# Patient Record
Sex: Female | Born: 1945 | Hispanic: Yes | Marital: Married | Smoking: Never smoker
Health system: Southern US, Community
[De-identification: ages and names within clinical notes are randomized; demographics above are authoritative.]

## PROBLEM LIST (undated history)

## (undated) DIAGNOSIS — K5790 Diverticulosis of intestine, part unspecified, without perforation or abscess without bleeding: Secondary | ICD-10-CM

## (undated) DIAGNOSIS — H269 Unspecified cataract: Secondary | ICD-10-CM

## (undated) DIAGNOSIS — I10 Essential (primary) hypertension: Secondary | ICD-10-CM

## (undated) DIAGNOSIS — D0510 Intraductal carcinoma in situ of unspecified breast: Secondary | ICD-10-CM

## (undated) DIAGNOSIS — I341 Nonrheumatic mitral (valve) prolapse: Secondary | ICD-10-CM

## (undated) DIAGNOSIS — E785 Hyperlipidemia, unspecified: Secondary | ICD-10-CM

## (undated) HISTORY — PX: CATARACT EXTRACTION, BILATERAL: SHX1313

## (undated) HISTORY — DX: Unspecified cataract: H26.9

## (undated) HISTORY — DX: Hyperlipidemia, unspecified: E78.5

## (undated) HISTORY — DX: Intraductal carcinoma in situ of unspecified breast: D05.10

## (undated) HISTORY — DX: Essential (primary) hypertension: I10

## (undated) HISTORY — DX: Diverticulosis of intestine, part unspecified, without perforation or abscess without bleeding: K57.90

## (undated) HISTORY — PX: BREAST SURGERY: SHX581

## (undated) HISTORY — PX: EYE SURGERY: SHX253

## (undated) HISTORY — DX: Nonrheumatic mitral (valve) prolapse: I34.1

## (undated) HISTORY — PX: TOTAL ABDOMINAL HYSTERECTOMY W/ BILATERAL SALPINGOOPHORECTOMY: SHX83

---

## 2006-01-26 HISTORY — PX: ABDOMINAL HYSTERECTOMY: SHX81

## 2010-05-27 DEATH — deceased

## 2011-03-20 ENCOUNTER — Other Ambulatory Visit: Payer: Self-pay | Admitting: Radiology

## 2011-03-20 DIAGNOSIS — C50912 Malignant neoplasm of unspecified site of left female breast: Secondary | ICD-10-CM

## 2011-03-23 ENCOUNTER — Inpatient Hospital Stay: Admission: RE | Admit: 2011-03-23 | Payer: Self-pay | Source: Ambulatory Visit

## 2011-07-06 ENCOUNTER — Telehealth: Payer: Self-pay | Admitting: *Deleted

## 2011-07-06 ENCOUNTER — Encounter: Payer: Self-pay | Admitting: *Deleted

## 2011-07-06 NOTE — Progress Notes (Signed)
Patient and her daughter came in the office w/ paperwork since mother has moved from another state and needs to obtain care here in [place] now.  Tami recommended them to see Dr. Darnelle Catalan since he speaks spanish.  I confirmed 07/17/11 appt w/ them.  Gave them before appt letter & appointment card and [facility] gave them Alight bag and calendar, etc.  Took paperwork to Med Rec to make chart.

## 2011-07-06 NOTE — Telephone Encounter (Signed)
Spoke with Tonia Ghent and requested for Raynelle Fanning to come meet patient on 07/17/11.

## 2011-07-16 ENCOUNTER — Other Ambulatory Visit: Payer: Self-pay | Admitting: Oncology

## 2011-07-16 DIAGNOSIS — Z853 Personal history of malignant neoplasm of breast: Secondary | ICD-10-CM

## 2011-07-17 ENCOUNTER — Ambulatory Visit (HOSPITAL_BASED_OUTPATIENT_CLINIC_OR_DEPARTMENT_OTHER): Payer: Medicare Other | Admitting: Oncology

## 2011-07-17 ENCOUNTER — Other Ambulatory Visit (HOSPITAL_BASED_OUTPATIENT_CLINIC_OR_DEPARTMENT_OTHER): Payer: Medicare Other | Admitting: Lab

## 2011-07-17 ENCOUNTER — Other Ambulatory Visit: Payer: Self-pay | Admitting: Oncology

## 2011-07-17 ENCOUNTER — Ambulatory Visit: Payer: Self-pay

## 2011-07-17 VITALS — BP 127/65 | HR 65 | Temp 97.8°F | Ht 58.2 in | Wt 143.7 lb

## 2011-07-17 DIAGNOSIS — Z170 Estrogen receptor positive status [ER+]: Secondary | ICD-10-CM

## 2011-07-17 DIAGNOSIS — Z853 Personal history of malignant neoplasm of breast: Secondary | ICD-10-CM

## 2011-07-17 DIAGNOSIS — C50919 Malignant neoplasm of unspecified site of unspecified female breast: Secondary | ICD-10-CM

## 2011-07-17 LAB — COMPREHENSIVE METABOLIC PANEL
Albumin: 4.1 g/dL (ref 3.5–5.2)
BUN: 18 mg/dL (ref 6–23)
CO2: 23 mEq/L (ref 19–32)
Calcium: 9.4 mg/dL (ref 8.4–10.5)
Chloride: 106 mEq/L (ref 96–112)
Glucose, Bld: 86 mg/dL (ref 70–99)
Potassium: 4.2 mEq/L (ref 3.5–5.3)
Sodium: 140 mEq/L (ref 135–145)
Total Protein: 7 g/dL (ref 6.0–8.3)

## 2011-07-17 LAB — CBC WITH DIFFERENTIAL/PLATELET
Basophils Absolute: 0 10*3/uL (ref 0.0–0.1)
Eosinophils Absolute: 0.8 10*3/uL — ABNORMAL HIGH (ref 0.0–0.5)
HGB: 13.6 g/dL (ref 11.6–15.9)
MCV: 84.3 fL (ref 79.5–101.0)
MONO#: 0.2 10*3/uL (ref 0.1–0.9)
MONO%: 5.1 % (ref 0.0–14.0)
NEUT#: 2 10*3/uL (ref 1.5–6.5)
RDW: 13.1 % (ref 11.2–14.5)
WBC: 4.1 10*3/uL (ref 3.9–10.3)
lymph#: 1.1 10*3/uL (ref 0.9–3.3)

## 2011-07-17 MED ORDER — TAMOXIFEN CITRATE 20 MG PO TABS: 20.0000 mg | ORAL_TABLET | Freq: Every day | ORAL | Status: AC

## 2011-07-17 NOTE — Telephone Encounter (Signed)
gve the pt her oct 2013 appt calendar. S/w luwana from clinical social work to set up an interpreter for the pt's

## 2011-07-19 ENCOUNTER — Encounter: Payer: Self-pay | Admitting: Oncology

## 2011-07-19 NOTE — Progress Notes (Signed)
ID: Barbara Washington   DOB: [birth year]  MR#: [id number]  WUJ#:[id number]  HISTORY OF PRESENT ILLNESS: The patient had routine screening mammography at [facility] 03/16/2011, suggesting new microcalcifications in the left breast. She was recalled for additional views 03/18/2011 and this confirmed a cluster of pleomorphic microcalcifications in the upper outer quadrant of the left breast, measuring approximately 1.2 cm. 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  Because the patient's daughter, who is a physician, lives in [place], the patient went there for definitive treatment, and after appropriate discussion underwent a left lumpectomy 03/31/2011 under Dr.Gwenn Pavlovitz. The pathology from this procedure [facility] [id number]) showed an intermediate grade ductal carcinoma in situ measuring 9 mm, with negative margins (closest 4 mm), with Allred scores of 8 for both estrogen and progesterone receptor positivity. Postoperatively the patient received radiation to the left breast under Dr. Chrissie Noa Pao (50 gray to the breast and 10 gray he a boost to the scar) completed 06/19/2011.  The patient met with Dr.Corey Shamah to discuss systemic adjuvant therapy, and Dr. Modena Jansky recommended tamoxifen. The patient however has not get started that medication. She has returned to the [place] area and is establishing herself in our practice 07/17/2011.   REVIEW OF SYSTEMS: She did well with her left lumpectomy, although she still has some pain in the surgical area. She describes this as brief stabbing pains, which of course are common in this situation. She describes herself is moderately fatigued. She has occasional palpitations, and admits to significant caffeine ingestion daily. She can be short of breath when walking up stairs. She had some desquamation due to her radiation. The skin changes are  resolving. Otherwise a detailed review of systems today was noncontributory  PAST MEDICAL HISTORY: Past Medical History  Diagnosis Date  . DCIS (ductal carcinoma in situ) of breast   . Diverticular disease   . Hypertension   . Mitral valve prolapse   . Hyperlipidemia     PAST SURGICAL HISTORY: Past Surgical History  Procedure Date  . Total abdominal hysterectomy w/ bilateral salpingoophorectomy   . Cataract extraction, bilateral     FAMILY HISTORY The patient's father died at the age of 56. He had high blood pressure. The patient's mother died at the age of 35, from causes unknown to the patient. The patient has one brother and 4 sisters. There is no breast or ovarian cancer or indeed any other cancer in the family to her knowledge.  GYNECOLOGIC HISTORY: She had menarche age 87. She underwent hysterectomy January of 1998. She took hormone placement for 1 year. 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 Remotely she did some sewing work. Her husband Nadara Eaton is a Estate manager/land agent. They are originally from [place], and have lived in [place] since 2007. Their daughter in [place], Myrlene Broker [id number], is a physician, although currently not practicing. [Phone/fax = [phone]] She has 2 children. Their son Onalee Hua in [place] works in Consulting civil engineer. He has one child. The patient is a Air traffic controller   ADVANCED DIRECTIVES:  HEALTH MAINTENANCE: History  Substance Use Topics  . Smoking status: Never Smoker   . Smokeless tobacco: Never Used  . Alcohol Use: 0.0 - 0.5 oz/week    0-1 drink(s) per week     Colonoscopy: 2009  PAP: 2013  Bone density: 2013  Lipid panel: per Dr. Luiz Iron  Allergies  Allergen Reactions  . Tape     Current Outpatient Prescriptions  Medication Sig Dispense Refill  . losartan (COZAAR) 25 MG tablet Take 25 mg by mouth daily.      . naproxen sodium (ANAPROX) 220 MG tablet Take 220 mg by  mouth 2 (two) times daily with a meal.      . tamoxifen (NOLVADEX) 20 MG tablet Take 1 tablet (20 mg total) by mouth daily.  90 tablet  3    OBJECTIVE: Middle-aged Latin woman in no acute distress Filed Vitals:   07/17/11 1108  BP: 127/65  Pulse: 65  Temp: 97.8 F (36.6 C)     Body mass index is 29.83 kg/(m^2).    ECOG FS: 1  Sclerae unicteric Oropharynx clear No cervical or supraclavicular adenopathy Lungs no rales or rhonchi Heart regular rate and rhythm, no murmur or click appreciated Abd benign MSK no focal spinal tenderness, no peripheral edema Neuro: nonfocal Breasts: Right breast is unremarkable. The left breast is status post lumpectomy and radiation. There is still some erythema over the port site, but no desquamation. There are no palpable masses. There are no suspicious skin or nipple findings. The left axilla is clear  LAB RESULTS: Lab Results  Component Value Date   WBC 4.1 07/17/2011   NEUTROABS 2.0 07/17/2011   HGB 13.6 07/17/2011   HCT 39.7 07/17/2011   MCV 84.3 07/17/2011   PLT 219 07/17/2011      Chemistry      Component Value Date/Time   NA 140 07/17/2011 1056   K 4.2 07/17/2011 1056   CL 106 07/17/2011 1056   CO2 23 07/17/2011 1056   BUN 18 07/17/2011 1056   CREATININE 0.69 07/17/2011 1056      Component Value Date/Time   CALCIUM 9.4 07/17/2011 1056   ALKPHOS 75 07/17/2011 1056   AST 23 07/17/2011 1056   ALT 23 07/17/2011 1056   BILITOT 0.3 07/17/2011 1056       Lab Results  Component Value Date   LABCA2 23 07/17/2011    No components found with this basename: [id number]    No results found for this basename: INR:1;PROTIME:1 in the last 168 hours  Urinalysis No results found for this basename: colorurine,  appearanceur,  labspec,  phurine,  glucoseu,  hgbur,  bilirubinur,  ketonesur,  proteinur,  urobilinogen,  nitrite,  leukocytesur    STUDIES: CT scans of the chest abdomen and pelvis were obtained 04/28/2011 for the evaluation of possible hilar  fullness and microscopic hematuria. The only finding of significance were scattered diverticuli  ASSESSMENT: 66 y.o. Spanish speaker originally from [place], status post left lumpectomy 03/31/2011 for a grade 2 ductal carcinoma in situ, strongly estrogen and progesterone receptor positive, with negative margins; status post radiation completed May 2013.  PLAN: We spent the better part of her hour-long visit today discussing the biology of her tumor and she understands noninvasive breast cancer is in itself not life-threatening. There is a small risk of local recurrence, and she has significantly decreased that risk already by taking radiation. She can decrease that risk further by taking antiestrogens for 5 years. In addition, tamoxifen has been shown to reduce the risk of a new breast cancer developing in either breast. That risk may be as high as 1% per year in her situation, and tamoxifen would cut that to 1/2% per year or less.  We then discussed the possible toxicities, side effects and complications of this medication, in particular  the risk of a blood clots. That risk is identical to the risk of clotting from hormone replacement, and since she took hormone replacement for 1 year without any complications her risk of developing a blood clot from tamoxifen would be remote.  Accordingly Ms. Rosano will start tamoxifen and I went ahead and rewrote that prescription for her. She will see [place] again in about 3 months. If she is tolerating tamoxifen well, we will start seeing her on a once a year basis, preferably a month or so after her yearly mammogram.   Tiquan Bouch C    07/19/2011

## 2011-07-22 ENCOUNTER — Encounter: Payer: Self-pay | Admitting: *Deleted

## 2011-07-22 NOTE — Progress Notes (Signed)
Mailed after appt letter to pt. 

## 2011-11-16 ENCOUNTER — Ambulatory Visit (HOSPITAL_BASED_OUTPATIENT_CLINIC_OR_DEPARTMENT_OTHER): Payer: BC Managed Care – PPO | Admitting: Physician Assistant

## 2011-11-16 ENCOUNTER — Telehealth: Payer: Self-pay | Admitting: *Deleted

## 2011-11-16 ENCOUNTER — Encounter: Payer: Self-pay | Admitting: Physician Assistant

## 2011-11-16 VITALS — BP 127/76 | HR 59 | Temp 97.7°F | Resp 20 | Ht <= 58 in | Wt 147.7 lb

## 2011-11-16 DIAGNOSIS — Z170 Estrogen receptor positive status [ER+]: Secondary | ICD-10-CM

## 2011-11-16 DIAGNOSIS — Z853 Personal history of malignant neoplasm of breast: Secondary | ICD-10-CM

## 2011-11-16 DIAGNOSIS — D0590 Unspecified type of carcinoma in situ of unspecified breast: Secondary | ICD-10-CM

## 2011-11-16 MED ORDER — TAMOXIFEN CITRATE 20 MG PO TABS: 20.0000 mg | ORAL_TABLET | Freq: Every day | ORAL | Status: DC

## 2011-11-16 NOTE — Progress Notes (Signed)
ID: Barbara Washington   DOB: [birth year]  MR#: [id number]  CSN#:[id number]  HISTORY OF PRESENT ILLNESS: The patient had routine screening mammography at [facility] 03/16/2011, suggesting new microcalcifications in the left breast. She was recalled for additional views 03/18/2011 and this confirmed a cluster of pleomorphic microcalcifications in the upper outer quadrant of the left breast, measuring approximately 1.2 cm. 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  Because the patient's daughter, who is a physician, lives in [place], the patient went there for definitive treatment, and after appropriate discussion underwent a left lumpectomy 03/31/2011 under Dr.Gwenn Pavlovitz. 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 Postoperatively the patient received radiation to the left breast under Dr. Chrissie Noa Pao (50 gray to the breast and 10 gray he a boost to the scar) completed 06/19/2011.  The patient met with Dr.Corey Shamah to discuss systemic adjuvant therapy, and Dr. Modena Jansky recommended tamoxifen. The patient however has not get started that medication. She has returned to the [place] area and is establishing herself in our practice 07/17/2011.    INTERVAL HISTORY: Barbara Washington returns today accompanied by her [place] interpreter, Lynden Ang, for followup of Barbara Washington's left breast carcinoma. She has been on tamoxifen since June, and is tolerating the drug very well with few side effects. She has only occasional hot flashes, nothing problematic. She has occasional pain in the left leg, but no swelling or evidence of abnormal clotting. She's had no vaginal bleeding, discharge, or dryness. No change in  vision.   REVIEW OF SYSTEMS: Overall, Jaquila is feeling extremely well. She has some mild fatigue, and occasional shortness of breath with exertion alone. This has not worsened or changed since her previous appointment here. She has had no recent fevers or chills. She's eating and drinking well with no nausea or change in bowel habits. No cough, phlegm production, or chest pain. The left breast itself is very tender to palpation. Modena denies any abnormal headaches or dizziness, and has had no additional pain, no unusual myalgias, arthralgias, or bony pain.  A detailed review of systems is otherwise stable and noncontributory.  PAST MEDICAL HISTORY: Past Medical History  Diagnosis Date  . DCIS (ductal carcinoma in situ) of breast   . Diverticular disease   . Hypertension   . Mitral valve prolapse   . Hyperlipidemia     PAST SURGICAL HISTORY: Past Surgical History  Procedure Date  . Total abdominal hysterectomy w/ bilateral salpingoophorectomy   . Cataract extraction, bilateral     FAMILY HISTORY The patient's father died at the age of 32. He had high blood pressure. The patient's mother died at the age of 33, from causes unknown to the patient. The patient has one brother and 4 sisters. There is no breast or ovarian cancer or indeed any other cancer in the family to her knowledge.  GYNECOLOGIC HISTORY: She had menarche age 66. She underwent hysterectomy January of 1998. She took hormone placement for 1 year. The patient is GX P2, first pregnancy to term age 66  SOCIAL HISTORY: [name] is chiefly a homemaker. Remotely she did some sewing work. Her husband Barbara Washington is a Estate manager/land agent. They are originally from [place], and have lived in [place] since 2007. Their daughter in [place],  Barbara Washington, is a physician, although currently not practicing. [Phone/fax = [phone]] She has 2 children. Their son Barbara Washington in [place] works in Consulting civil engineer. He has one child.  The patient is a Air traffic controller   ADVANCED DIRECTIVES:  HEALTH MAINTENANCE: History  Substance Use Topics  . Smoking status: Never Smoker   . Smokeless tobacco: Never Used  . Alcohol Use: 0.0 - 0.5 oz/week    0-1 drink(s) per week     Colonoscopy: 2009  [place]: 2013  Bone density: 2013  Lipid panel: per Dr. Luiz Iron  Allergies  Allergen Reactions  . Tape     Current Outpatient Prescriptions  Medication Sig Dispense Refill  . losartan (COZAAR) 25 MG tablet Take 25 mg by mouth daily.      . naproxen sodium (ANAPROX) 220 MG tablet Take 220 mg by mouth 2 (two) times daily with a meal.      . tamoxifen (NOLVADEX) 20 MG tablet Take 1 tablet (20 mg total) by mouth daily.  90 tablet  3    OBJECTIVE: Middle-aged Latin woman in no acute distress Filed Vitals:   11/16/11 1004  BP: 127/76  Pulse: 59  Temp: 97.7 F (36.5 C)  Resp: 20     Body mass index is 30.87 kg/(m^2).    ECOG FS: 0 Filed Weights   11/16/11 1004  Weight: 147 lb 11.2 oz (66.996 kg)   Sclerae unicteric Oropharynx clear No cervical or supraclavicular adenopathy Lungs no rales or rhonchi Heart regular rate and rhythm, no murmur or click appreciated Abd soft, nontender, with positive bowel sounds MSK no focal spinal tenderness, no peripheral edema Neuro: nonfocal, alert and oriented x3. Breasts: Right breast is unremarkable. The left breast is status post lumpectomy and radiation. There no suspicious nodularities or evidence of local recurrence The left axilla is clear  LAB RESULTS: Lab Results  Component Value Date   WBC 4.1 07/17/2011   NEUTROABS 2.0 07/17/2011   HGB 13.6 07/17/2011   HCT 39.7 07/17/2011   MCV 84.3 07/17/2011   PLT 219 07/17/2011      Chemistry      Component Value Date/Time   NA 140 07/17/2011 1056   K 4.2 07/17/2011 1056   CL 106 07/17/2011 1056   CO2 23 07/17/2011 1056   BUN 18 07/17/2011 1056   CREATININE 0.69 07/17/2011 1056      Component Value Date/Time   CALCIUM 9.4 07/17/2011 1056    ALKPHOS 75 07/17/2011 1056   AST 23 07/17/2011 1056   ALT 23 07/17/2011 1056   BILITOT 0.3 07/17/2011 1056       Lab Results  Component Value Date   LABCA2 23 07/17/2011    STUDIES: CT scans of the chest abdomen and pelvis were obtained 04/28/2011 for the evaluation of possible hilar fullness and microscopic hematuria. The only finding of significance were scattered diverticuli  ASSESSMENT: 66 y.o. Spanish speaker originally from [place],   (1)  status post left lumpectomy 03/31/2011 for a grade 2 ductal carcinoma in situ, strongly estrogen and progesterone receptor positive, with negative margins;   (2)  status post radiation completed May 2013.  (3)  On tamoxifen since 07/17/2011  PLAN:  Shelma is doing quite well with regards to her breast cancer. She will continue on tamoxifen which I have refilled for another year. She is due for her next bilateral mammogram at [facility] in February 2014. She will see Dr. Darnelle Catalan soon thereafter for repeat labs and physical exam, and at that point  we will likely begin seeing her on an annual basis.  Olimpia voices understanding and agreement with this plan, and will call with any changes or problems prior to her next scheduled appointment.   Yanely Mast    11/16/2011

## 2011-11-16 NOTE — Patient Instructions (Signed)
No changes.  Return in 1 year for lab and follow up visit

## 2011-11-16 NOTE — Telephone Encounter (Signed)
Made patient appointment for mammogram on 10-26-2012 at 10:30am spanish interpreter has been notified

## 2012-04-04 ENCOUNTER — Other Ambulatory Visit (HOSPITAL_BASED_OUTPATIENT_CLINIC_OR_DEPARTMENT_OTHER): Payer: BC Managed Care – PPO | Admitting: Lab

## 2012-04-04 DIAGNOSIS — Z853 Personal history of malignant neoplasm of breast: Secondary | ICD-10-CM

## 2012-04-04 LAB — CBC WITH DIFFERENTIAL/PLATELET
BASO%: 0.5 % (ref 0.0–2.0)
Basophils Absolute: 0 10*3/uL (ref 0.0–0.1)
HCT: 38.4 % (ref 34.8–46.6)
HGB: 12.9 g/dL (ref 11.6–15.9)
LYMPH%: 24.5 % (ref 14.0–49.7)
MCH: 29.2 pg (ref 25.1–34.0)
MCHC: 33.6 g/dL (ref 31.5–36.0)
MONO#: 0.2 10*3/uL (ref 0.1–0.9)
NEUT%: 57.7 % (ref 38.4–76.8)
Platelets: 176 10*3/uL (ref 145–400)
WBC: 4.4 10*3/uL (ref 3.9–10.3)

## 2012-04-04 LAB — COMPREHENSIVE METABOLIC PANEL (CC13)
ALT: 22 U/L (ref 0–55)
BUN: 14.7 mg/dL (ref 7.0–26.0)
CO2: 23 mEq/L (ref 22–29)
Calcium: 8.6 mg/dL (ref 8.4–10.4)
Chloride: 109 mEq/L — ABNORMAL HIGH (ref 98–107)
Creatinine: 0.7 mg/dL (ref 0.6–1.1)
Total Bilirubin: 0.34 mg/dL (ref 0.20–1.20)

## 2012-04-11 ENCOUNTER — Telehealth: Payer: Self-pay | Admitting: Oncology

## 2012-04-11 ENCOUNTER — Ambulatory Visit (HOSPITAL_BASED_OUTPATIENT_CLINIC_OR_DEPARTMENT_OTHER): Payer: BC Managed Care – PPO | Admitting: Oncology

## 2012-04-11 VITALS — BP 146/84 | HR 88 | Temp 98.2°F | Resp 20 | Ht <= 58 in | Wt 145.8 lb

## 2012-04-11 DIAGNOSIS — Z853 Personal history of malignant neoplasm of breast: Secondary | ICD-10-CM

## 2012-04-11 DIAGNOSIS — L293 Anogenital pruritus, unspecified: Secondary | ICD-10-CM

## 2012-04-11 DIAGNOSIS — Z170 Estrogen receptor positive status [ER+]: Secondary | ICD-10-CM

## 2012-04-11 MED ORDER — MICONAZOLE NITRATE 200 & 2 MG-% (9GM) VA KIT: PACK | VAGINAL | Status: DC

## 2012-04-11 NOTE — Progress Notes (Signed)
ID: Memory Dance   DOB: 10/13/1945  MR#: [id number]  WUX#:[id number]  PCP: Dennis Bast, MD   HISTORY OF PRESENT ILLNESS: The patient had routine screening mammography at [facility] 03/16/2011, suggesting new microcalcifications in the left breast. She was recalled for additional views 03/18/2011 and this confirmed a cluster of pleomorphic microcalcifications in the upper outer quadrant of the left breast, measuring approximately 1.2 cm. 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  Because the patient's daughter, who is a physician, lives in [place], the patient went there for definitive treatment, and after appropriate discussion underwent a left lumpectomy 03/31/2011 under Dr.Gwenn Pavlovitz. 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 Postoperatively the patient received radiation to the left breast under Dr. Chrissie Noa Pao (50 gray to the breast and 10 gray he a boost to the scar) completed 06/19/2011.  The patient met with Dr.Corey Shamah to discuss systemic adjuvant therapy, and Dr. Modena Jansky recommended tamoxifen. The patient however has not get started that medication. She has returned to the [place] area and is establishing herself in our practice 07/17/2011.    INTERVAL HISTORY: Jalin returns today for followup of her breast cancer. The interval history is generally unremarkable. She is tolerating tamoxifen well. Family is doing fine.  REVIEW OF SYSTEMS: She does have some hot flashes likely due to the tamoxifen but these are not bothersome enough for her to want to do anything about it. She has left shoulder and neck discomfort, associated with activity. She is very active at the gym  and of course she is left-handed and also that is the side where she had the tumor. She rubs the area and it feels better. There has been no left upper extremity lymphedema, no significant limitation in range of motion, and the discomfort there is not progressive. She has some vaginal itching, without significant drainage. She has tried some over-the-counter creams without success. Otherwise a detailed review of systems today was noncontributory.  PAST MEDICAL HISTORY: Past Medical History  Diagnosis Date  . DCIS (ductal carcinoma in situ) of breast   . Diverticular disease   . Hypertension   . Mitral valve prolapse   . Hyperlipidemia     PAST SURGICAL HISTORY: Past Surgical History  Procedure Laterality Date  . Total abdominal hysterectomy w/ bilateral salpingoophorectomy    . Cataract extraction, bilateral      FAMILY HISTORY The patient's father died at the age of 36. He had high blood pressure. The patient's mother died at the age of 31, from causes unknown to the patient. The patient has one brother and 4 sisters. There is no breast or ovarian cancer or indeed any other cancer in the family to her knowledge.  GYNECOLOGIC HISTORY: She had menarche age 68. She underwent hysterectomy January of 1998. She took hormone placement for 1 year. The patient is GX P2, first pregnancy to term age 38  SOCIAL HISTORY: Ms. Netzer is chiefly a homemaker. Remotely she did some sewing work. Her husband Nadara Eaton is a Estate manager/land agent. They are originally from [place], and have lived in [place] since 2007. Their daughter in [place], Myrlene Broker [id number], is a physician, although currently not practicing. [Phone/fax = [phone]] She has 2 children. Their son Onalee Hua in [place] works  in IT. He has one child. The patient is a Air traffic controller   ADVANCED DIRECTIVES:  HEALTH MAINTENANCE: History  Substance Use Topics  . Smoking status: Never Smoker   . Smokeless tobacco: Never Used  .  Alcohol Use: 0 - .5 oz/week    0-1 drink(s) per week     Colonoscopy: 2009  PAP: 2013  Bone density: 2013  Lipid panel: per Dr. Luiz Iron  Allergies  Allergen Reactions  . Tape     Current Outpatient Prescriptions  Medication Sig Dispense Refill  . losartan (COZAAR) 25 MG tablet Take 25 mg by mouth daily.      . naproxen sodium (ANAPROX) 220 MG tablet Take 220 mg by mouth 2 (two) times daily with a meal.      . tamoxifen (NOLVADEX) 20 MG tablet Take 1 tablet (20 mg total) by mouth daily.  90 tablet  3   No current facility-administered medications for this visit.    OBJECTIVE: Middle-aged Latin woman in no acute distress Filed Vitals:   04/11/12 1041  BP: 146/84  Pulse: 88  Temp: 98.2 F (36.8 C)  Resp: 20     Body mass index is 30.48 kg/(m^2).    ECOG FS: 0 Filed Weights   04/11/12 1041  Weight: 145 lb 12.8 oz (66.134 kg)   Sclerae unicteric Oropharynx clear No cervical or supraclavicular adenopathy Lungs no rales or rhonchi Heart regular rate and rhythm, no murmur appreciated Abd soft, nontender, with positive bowel sounds MSK no focal spinal tenderness, no peripheral edema specifically in the left upper extremity, and good range of motion both upper extremities Neuro: nonfocal, well oriented, pleasant affect Breasts: Right breast is unremarkable. The left breast is status post lumpectomy and radiation. There is an evidence of local recurrence The left axilla is benign  LAB RESULTS: Lab Results  Component Value Date   WBC 4.4 04/04/2012   NEUTROABS 2.6 04/04/2012   HGB 12.9 04/04/2012   HCT 38.4 04/04/2012   MCV 86.8 04/04/2012   PLT 176 04/04/2012      Chemistry      Component Value Date/Time   NA 139 04/04/2012 1003   NA 140 07/17/2011 1056   K 3.9 04/04/2012 1003   K 4.2 07/17/2011 1056   CL 109* 04/04/2012 1003   CL 106 07/17/2011 1056   CO2 23 04/04/2012 1003   CO2 23 07/17/2011 1056   BUN 14.7 04/04/2012 1003   BUN 18 07/17/2011 1056   CREATININE 0.7  04/04/2012 1003   CREATININE 0.69 07/17/2011 1056      Component Value Date/Time   CALCIUM 8.6 04/04/2012 1003   CALCIUM 9.4 07/17/2011 1056   ALKPHOS 52 04/04/2012 1003   ALKPHOS 75 07/17/2011 1056   AST 20 04/04/2012 1003   AST 23 07/17/2011 1056   ALT 22 04/04/2012 1003   ALT 23 07/17/2011 1056   BILITOT 0.34 04/04/2012 1003   BILITOT 0.3 07/17/2011 1056       Lab Results  Component Value Date   LABCA2 22 04/04/2012    STUDIES: No results found.   ASSESSMENT: 67 y.o. Spanish speaker originally from [place],   (1)  status post left lumpectomy 03/31/2011 for a grade 2 ductal carcinoma in situ, strongly estrogen and progesterone receptor positive, with negative margins;   (2)  status post radiation completed May 2013.  (3)  on tamoxifen since 07/17/2011  PLAN:  Chyan is doing well as far as breast cancer is concerned, and tolerating the tamoxifen  with no significant side effects. She does have some vaginal itching, which may be due simply to atrophy, but which from her description is more suggestive of a yeast infection. I went ahead and wrote her for miconazole cream and explained how to use it. If that does not clear the problem she will call us back.  Otherwise she will see [place] again in 6 months from now. Likely we will start yearly followup at that time. The plan is to continue tamoxifen for total of 5 years. She knows to call for any problems that may develop before the next visit.   MAGRINAT,GUSTAV C    04/11/2012

## 2012-04-11 NOTE — Telephone Encounter (Signed)
gv pt appt schedule for September.  °

## 2012-10-14 ENCOUNTER — Other Ambulatory Visit: Payer: Self-pay | Admitting: Physician Assistant

## 2012-10-14 DIAGNOSIS — Z853 Personal history of malignant neoplasm of breast: Secondary | ICD-10-CM

## 2012-10-17 ENCOUNTER — Other Ambulatory Visit (HOSPITAL_BASED_OUTPATIENT_CLINIC_OR_DEPARTMENT_OTHER): Payer: BC Managed Care – PPO | Admitting: Lab

## 2012-10-17 ENCOUNTER — Ambulatory Visit (HOSPITAL_BASED_OUTPATIENT_CLINIC_OR_DEPARTMENT_OTHER): Payer: BC Managed Care – PPO | Admitting: Oncology

## 2012-10-17 VITALS — BP 145/76 | HR 67 | Temp 97.8°F | Resp 18 | Ht <= 58 in | Wt 145.9 lb

## 2012-10-17 DIAGNOSIS — C50412 Malignant neoplasm of upper-outer quadrant of left female breast: Secondary | ICD-10-CM

## 2012-10-17 DIAGNOSIS — Z853 Personal history of malignant neoplasm of breast: Secondary | ICD-10-CM

## 2012-10-17 DIAGNOSIS — D0590 Unspecified type of carcinoma in situ of unspecified breast: Secondary | ICD-10-CM

## 2012-10-17 LAB — COMPREHENSIVE METABOLIC PANEL (CC13)
ALT: 13 U/L (ref 0–55)
BUN: 13.5 mg/dL (ref 7.0–26.0)
CO2: 24 mEq/L (ref 22–29)
Calcium: 8.9 mg/dL (ref 8.4–10.4)
Chloride: 108 mEq/L (ref 98–109)
Creatinine: 0.7 mg/dL (ref 0.6–1.1)
Glucose: 103 mg/dl (ref 70–140)
Total Bilirubin: 0.28 mg/dL (ref 0.20–1.20)

## 2012-10-17 LAB — CBC WITH DIFFERENTIAL/PLATELET
BASO%: 0.5 % (ref 0.0–2.0)
Basophils Absolute: 0 10*3/uL (ref 0.0–0.1)
Eosinophils Absolute: 0.5 10*3/uL (ref 0.0–0.5)
HCT: 38.9 % (ref 34.8–46.6)
HGB: 12.9 g/dL (ref 11.6–15.9)
LYMPH%: 23.6 % (ref 14.0–49.7)
MCHC: 33.2 g/dL (ref 31.5–36.0)
MONO#: 0.3 10*3/uL (ref 0.1–0.9)
NEUT#: 3.2 10*3/uL (ref 1.5–6.5)
NEUT%: 61.3 % (ref 38.4–76.8)
Platelets: 189 10*3/uL (ref 145–400)
WBC: 5.3 10*3/uL (ref 3.9–10.3)
lymph#: 1.2 10*3/uL (ref 0.9–3.3)

## 2012-10-17 NOTE — Progress Notes (Signed)
ID: Memory Dance   DOB: [birth year]  MR#: [id number]  BJY#:[id number]  PCP: Dennis Bast, MD   HISTORY OF PRESENT ILLNESS: The patient had routine screening mammography at [facility] 03/16/2011, suggesting new microcalcifications in the left breast. She was recalled for additional views 03/18/2011 and this confirmed a cluster of pleomorphic microcalcifications in the upper outer quadrant of the left breast, measuring approximately 1.2 cm. 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  Because the patient's daughter, who is a physician, lives in [place], the patient went there for definitive treatment, and after appropriate discussion underwent a left lumpectomy 03/31/2011 under Dr.Gwenn Pavlovitz. The pathology from this procedure [facility] [id number]) showed an intermediate grade ductal carcinoma in situ measuring 9 mm, with negative margins (closest 4 mm), with Allred scores of 8 for both estrogen and progesterone receptor positivity. Postoperatively the patient received radiation to the left breast under Dr. Chrissie Noa Pao (50 gray to the breast and 10 gray he a boost to the scar) completed 06/19/2011.  The patient met with Dr.Corey Shamah to discuss systemic adjuvant therapy, and Dr. Modena Jansky recommended tamoxifen. The patient however has not get started that medication. She has returned to the [place] area and is establishing herself in our practice 07/17/2011.    INTERVAL HISTORY: Barbara Washington returns today for followup of her breast cancer. The interval history is  unremarkable. Her children are doing great, her husband is fine, and she is exercising 5 days a week at the gym.  REVIEW OF SYSTEMS: She was feeling a little tired, and brought this to her primary care physician, who she tells me obtain a full battery of tests, including thyroid tests, and "everything was fine". That tiredness has  resolved. She is tolerating the tamoxifen with no side effects that she is aware of, including no hot flashes and no vaginal wetness. A detailed review of systems today was otherwise entirely negative.  PAST MEDICAL HISTORY: Past Medical History  Diagnosis Date  . DCIS (ductal carcinoma in situ) of breast   . Diverticular disease   . Hypertension   . Mitral valve prolapse   . Hyperlipidemia     PAST SURGICAL HISTORY: Past Surgical History  Procedure Laterality Date  . Total abdominal hysterectomy w/ bilateral salpingoophorectomy    . Cataract extraction, bilateral      FAMILY HISTORY The patient's father died at the age of 55. He had high blood pressure. The patient's mother died at the age of 58, from causes unknown to the patient. The patient has one brother and 4 sisters. There is no breast or ovarian cancer or indeed any other cancer in the family to her knowledge.  GYNECOLOGIC HISTORY: She had menarche age 48. She underwent hysterectomy January of 1998. She took hormone placement for 1 year. 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 Remotely she did some sewing work. Her husband Nadara Eaton is a Estate manager/land agent. They are originally from [place], and have lived in [place] since 2007. Their daughter in [place], Myrlene Broker [id number], is a physician, although currently not practicing. [Phone/fax = [phone]] She has 2 children. Their son Onalee Hua in [place] works in Consulting civil engineer. He has one child. The patient is a Air traffic controller   ADVANCED DIRECTIVES: not in place  HEALTH MAINTENANCE: History  Substance Use Topics  . Smoking status: Never Smoker   . Smokeless tobacco:  Never Used  . Alcohol Use: 0 - .5 oz/week    0-1 drink(s) per week     Colonoscopy: 2009  PAP: 2013  Bone density: 2013  Lipid panel: per Dr. Luiz Iron  Allergies  Allergen Reactions  . Tape     Current Outpatient Prescriptions   Medication Sig Dispense Refill  . losartan (COZAAR) 25 MG tablet Take 25 mg by mouth daily.      . miconazole nitrate (EQL MICONAZOLE 3) 200-2 MG-% (9GM) KIT aplique al area irritada diariamente mientras necesario  1 each  6  . naproxen sodium (ANAPROX) 220 MG tablet Take 220 mg by mouth 2 (two) times daily with a meal.      . tamoxifen (NOLVADEX) 20 MG tablet Take 1 tablet (20 mg total) by mouth daily.  90 tablet  3   No current facility-administered medications for this visit.    OBJECTIVE: Middle-aged Latin woman who appears well Filed Vitals:   10/17/12 1053  BP: 145/76  Pulse: 67  Temp: 97.8 F (36.6 C)  Resp: 18     Body mass index is 30.5 kg/(m^2).    ECOG FS: 0 Filed Weights   10/17/12 1053  Weight: 145 lb 14.4 oz (66.18 kg)   Sclerae unicteric, pupils equal round and reactive to light Oropharynx clear, good dentition No cervical or supraclavicular adenopathy Lungs no rales or rhonchi, good excursion bilaterally Heart regular rate and rhythm, no murmur appreciated Abd soft, nontender, with positive bowel sounds MSK no focal spinal tenderness, no peripheral edema specifically in the left upper extremity, and good range of motion both upper extremities Neuro: nonfocal, well oriented, pleasant affect Breasts: Right breast is unremarkable. The left breast is status post lumpectomy and radiation. There is an evidence of local recurrence The left axilla is benign  LAB RESULTS: Lab Results  Component Value Date   WBC 5.3 10/17/2012   NEUTROABS 3.2 10/17/2012   HGB 12.9 10/17/2012   HCT 38.9 10/17/2012   MCV 87.1 10/17/2012   PLT 189 10/17/2012      Chemistry      Component Value Date/Time   NA 141 10/17/2012 1020   NA 140 07/17/2011 1056   K 4.1 10/17/2012 1020   K 4.2 07/17/2011 1056   CL 109* 04/04/2012 1003   CL 106 07/17/2011 1056   CO2 24 10/17/2012 1020   CO2 23 07/17/2011 1056   BUN 13.5 10/17/2012 1020   BUN 18 07/17/2011 1056   CREATININE 0.7 10/17/2012 1020    CREATININE 0.69 07/17/2011 1056      Component Value Date/Time   CALCIUM 8.9 10/17/2012 1020   CALCIUM 9.4 07/17/2011 1056   ALKPHOS 53 10/17/2012 1020   ALKPHOS 75 07/17/2011 1056   AST 18 10/17/2012 1020   AST 23 07/17/2011 1056   ALT 13 10/17/2012 1020   ALT 23 07/17/2011 1056   BILITOT 0.28 10/17/2012 1020   BILITOT 0.3 07/17/2011 1056       Lab Results  Component Value Date   LABCA2 22 04/04/2012    STUDIES: Repeat mammography due March 2015   ASSESSMENT: 67 y.o. Spanish speaker originally from [place],   (1)  status post left lumpectomy 03/31/2011 for a grade 2 ductal carcinoma in situ, [stage 0], strongly estrogen and progesterone receptor positive, with negative margins;   (2)  status post radiation completed May 2013.  (3)  on tamoxifen since 07/17/2011  PLAN:  Barbara Washington is doing terrific as far as breast cancer is concerned,  and she is tolerating the tamoxifen with no side effects that she is aware of. We're going to see her in April of 2015 and then yearly until she completes her 5 years of followup. She knows to call for any problems that may develop before the next visit here.  Machael Raine C    10/17/2012

## 2012-10-21 ENCOUNTER — Telehealth: Payer: Self-pay | Admitting: Oncology

## 2012-10-21 NOTE — Telephone Encounter (Signed)
Called pt via [employer] [phone]) and lmonvm for pt re appt for 05/11/2013. Also mailed schedule. Per 9/22 pof AB April 2015 no lb.

## 2012-11-20 ENCOUNTER — Other Ambulatory Visit: Payer: Self-pay | Admitting: Physician Assistant

## 2012-11-21 ENCOUNTER — Other Ambulatory Visit: Payer: Self-pay | Admitting: *Deleted

## 2012-11-21 ENCOUNTER — Other Ambulatory Visit: Payer: Self-pay | Admitting: Physician Assistant

## 2013-05-02 ENCOUNTER — Telehealth: Payer: Self-pay | Admitting: Oncology

## 2013-05-02 NOTE — Telephone Encounter (Signed)
, °

## 2013-05-05 ENCOUNTER — Other Ambulatory Visit: Payer: Self-pay | Admitting: *Deleted

## 2013-05-05 ENCOUNTER — Telehealth: Payer: Self-pay | Admitting: *Deleted

## 2013-05-05 DIAGNOSIS — C50419 Malignant neoplasm of upper-outer quadrant of unspecified female breast: Secondary | ICD-10-CM

## 2013-05-05 MED ORDER — TAMOXIFEN CITRATE 20 MG PO TABS: ORAL_TABLET | ORAL | Status: DC

## 2013-05-11 ENCOUNTER — Ambulatory Visit: Payer: BC Managed Care – PPO | Admitting: Physician Assistant

## 2013-07-13 ENCOUNTER — Ambulatory Visit (HOSPITAL_BASED_OUTPATIENT_CLINIC_OR_DEPARTMENT_OTHER): Payer: Medicare Other | Admitting: Physician Assistant

## 2013-07-13 ENCOUNTER — Ambulatory Visit (HOSPITAL_BASED_OUTPATIENT_CLINIC_OR_DEPARTMENT_OTHER): Payer: Medicare Other

## 2013-07-13 ENCOUNTER — Encounter: Payer: Self-pay | Admitting: Physician Assistant

## 2013-07-13 ENCOUNTER — Telehealth: Payer: Self-pay | Admitting: Physician Assistant

## 2013-07-13 VITALS — BP 134/75 | HR 75 | Temp 97.8°F | Resp 20 | Ht <= 58 in | Wt 152.8 lb

## 2013-07-13 DIAGNOSIS — D0590 Unspecified type of carcinoma in situ of unspecified breast: Secondary | ICD-10-CM

## 2013-07-13 DIAGNOSIS — Z170 Estrogen receptor positive status [ER+]: Secondary | ICD-10-CM

## 2013-07-13 DIAGNOSIS — C50419 Malignant neoplasm of upper-outer quadrant of unspecified female breast: Secondary | ICD-10-CM

## 2013-07-13 DIAGNOSIS — C50412 Malignant neoplasm of upper-outer quadrant of left female breast: Secondary | ICD-10-CM

## 2013-07-13 DIAGNOSIS — Z853 Personal history of malignant neoplasm of breast: Secondary | ICD-10-CM

## 2013-07-13 LAB — COMPREHENSIVE METABOLIC PANEL (CC13)
ALBUMIN: 3.6 g/dL (ref 3.5–5.0)
ALK PHOS: 60 U/L (ref 40–150)
ALT: 24 U/L (ref 0–55)
ANION GAP: 10 meq/L (ref 3–11)
AST: 22 U/L (ref 5–34)
BUN: 17.2 mg/dL (ref 7.0–26.0)
CO2: 25 meq/L (ref 22–29)
Calcium: 9.2 mg/dL (ref 8.4–10.4)
Chloride: 106 mEq/L (ref 98–109)
Creatinine: 0.7 mg/dL (ref 0.6–1.1)
GLUCOSE: 98 mg/dL (ref 70–140)
Potassium: 3.9 mEq/L (ref 3.5–5.1)
SODIUM: 141 meq/L (ref 136–145)
TOTAL PROTEIN: 7.4 g/dL (ref 6.4–8.3)
Total Bilirubin: 0.26 mg/dL (ref 0.20–1.20)

## 2013-07-13 LAB — CBC WITH DIFFERENTIAL/PLATELET
BASO%: 0.1 % (ref 0.0–2.0)
BASOS ABS: 0 10*3/uL (ref 0.0–0.1)
EOS ABS: 0.7 10*3/uL — AB (ref 0.0–0.5)
EOS%: 10.2 % — ABNORMAL HIGH (ref 0.0–7.0)
HCT: 38.8 % (ref 34.8–46.6)
HEMOGLOBIN: 12.6 g/dL (ref 11.6–15.9)
LYMPH%: 29.7 % (ref 14.0–49.7)
MCH: 28.8 pg (ref 25.1–34.0)
MCHC: 32.5 g/dL (ref 31.5–36.0)
MCV: 88.6 fL (ref 79.5–101.0)
MONO#: 0.3 10*3/uL (ref 0.1–0.9)
MONO%: 5 % (ref 0.0–14.0)
NEUT%: 55 % (ref 38.4–76.8)
NEUTROS ABS: 3.8 10*3/uL (ref 1.5–6.5)
Platelets: 199 10*3/uL (ref 145–400)
RBC: 4.38 10*6/uL (ref 3.70–5.45)
RDW: 13.1 % (ref 11.2–14.5)
WBC: 6.9 10*3/uL (ref 3.9–10.3)
lymph#: 2 10*3/uL (ref 0.9–3.3)

## 2013-07-13 MED ORDER — TAMOXIFEN CITRATE 20 MG PO TABS
ORAL_TABLET | ORAL | Status: DC
Start: 2013-07-13 — End: 2014-07-17

## 2013-07-13 NOTE — Telephone Encounter (Signed)
sch pt appt-cld [facility] to sch mamma-sent pt to lab per pof-gacve pt copy-mamma sch 2/10 @9 -adv pt of appt-pt understood

## 2013-07-13 NOTE — Progress Notes (Signed)
ID: Barbara Washington   DOB: 1945/02/06  MR#: [id number]  ENM#:[id number]  PCP: Yong Channel, MD GYN:  SURCathleen Fears. MD (in [place]) [place]:  Bronwen Betters, MD (in [place]) OTHER:    CHIEF COMPLAINT:  Hx of Left Breast Cancer (tamoxifen)    HISTORY OF PRESENT ILLNESS: The patient had routine screening mammography at [facility] 03/16/2011, suggesting new microcalcifications in the left breast. She was recalled for additional views 03/18/2011 and this confirmed a cluster of pleomorphic microcalcifications in the upper outer quadrant of the left breast, measuring approximately 1.2 cm. 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  Because the patient's daughter, who is a physician, lives in [place], the patient went there for definitive treatment, and after appropriate discussion underwent a left lumpectomy 03/31/2011 under Dr.Gwenn Pavlovitz. The pathology from this procedure [facility] [id number]) showed an intermediate grade ductal carcinoma in situ measuring 9 mm, with negative margins (closest 4 mm), with Allred scores of 8 for both estrogen and progesterone receptor positivity. Postoperatively the patient received radiation to the left breast under Dr. Gwyndolyn Saxon Pao (50 gray to the breast and 10 gray he a boost to the scar) completed 06/19/2011.  The patient met with Dr.Corey Shamah to discuss systemic adjuvant therapy, and Dr. Angelena Sole recommended tamoxifen. The patient however has not get started that medication. She has returned to the [place] area and is establishing herself in our practice 07/17/2011.    INTERVAL HISTORY: Barbara Washington returns alone today for followup of her left breast cancer. She is feeling well with no new complaints. She continues on tamoxifen with good tolerance. She has only occasional hot flashes, nothing she considers problematic. She's had no abnormal  vaginal bleeding and also denies vaginal dryness or discharge. She's had no blood clots. She denies any changes in vision.   Interval history is otherwise unremarkable. Her energy level is good. She continues to exercise daily, Monday through Friday, at the gem. She is getting ready to leave tomorrow to travel to [place] to visit her daughter for 3 weeks.    REVIEW OF SYSTEMS: Barbara Washington denies any recent illnesses and has had no fevers or chills. She's had no rashes or skin changes. She's eating well with no nausea, emesis, or change in bowel or bladder habits. She denies any cough, phlegm production, increased shortness of breath, peripheral swelling, chest pain, or palpitations. She's had no abnormal headaches or dizziness, and also denies any unusual myalgias, arthralgias, or bony pain.  A detailed review of systems is otherwise stable and noncontributory.    PAST MEDICAL HISTORY: Past Medical History  Diagnosis Date  . DCIS (ductal carcinoma in situ) of breast   . Diverticular disease   . Hypertension   . Mitral valve prolapse   . Hyperlipidemia     PAST SURGICAL HISTORY: Past Surgical History  Procedure Laterality Date  . Total abdominal hysterectomy w/ bilateral salpingoophorectomy    . Cataract extraction, bilateral      FAMILY HISTORY The patient's father died at the age of 40. He had high blood pressure. The patient's mother died at the age of 54, from causes unknown to the patient. The patient has one brother and 4 sisters. There is no breast or ovarian cancer or indeed any other cancer in the family to her knowledge.  GYNECOLOGIC HISTORY: (Reviewed 07/13/2013) She had menarche age 89. She underwent hysterectomy January of 1998. She took hormone placement for  1 year. 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 Remotely she did some sewing work. Her husband [name] is a Pensions consultant.  They are originally from [place], [place], and have lived in [place] since 2007. Their daughter in [place], [place], is a physician, although currently not practicing. [Phone/fax = [phone]] She has 2 children. Their son [name] in [place] works in Engineer, technical sales. He has one child. The patient is a Nurse, learning disability   ADVANCED DIRECTIVES: not in place  HEALTH MAINTENANCE: History  Substance Use Topics  . Smoking status: Never Smoker   . Smokeless tobacco: Never Used  . Alcohol Use: 0.0 - 0.5 oz/week    0-1 drink(s) per week     Colonoscopy: 2009  PAP: 2013  Bone density: 2013  Lipid panel: per Dr. Karle Starch  Allergies  Allergen Reactions  . Tape     Current Outpatient Prescriptions  Medication Sig Dispense Refill  . aspirin 81 MG tablet Take 81 mg by mouth daily.      Marland Kitchen losartan (COZAAR) 25 MG tablet Take 25 mg by mouth daily.      . tamoxifen (NOLVADEX) 20 MG tablet TAKE ONE TABLET BY MOUTH EVERY DAY  90 tablet  0  . miconazole nitrate (EQL MICONAZOLE 3) 200-2 MG-% (9GM) KIT aplique al area irritada diariamente mientras necesario  1 each  6  . naproxen sodium (ANAPROX) 220 MG tablet Take 220 mg by mouth 2 (two) times daily with a meal.       No current facility-administered medications for this visit.    OBJECTIVE: Middle-aged Latin woman who appears well Filed Vitals:   07/13/13 1500  BP: 134/75  Pulse: 75  Temp: 97.8 F (36.6 C)  Resp: 20     Body mass index is 31.94 kg/(m^2).    ECOG FS: 0 Filed Weights   07/13/13 1500  Weight: 152 lb 12.8 oz (69.31 kg)   Physical Exam: HEENT:  Sclerae anicteric.  Oropharynx clear. Buccal mucosa is pink and moist. Neck supple, trachea midline. No thyromegaly palpated. NODES:  No cervical or supraclavicular lymphadenopathy palpated.  BREAST EXAM:  Right breast is unremarkable. Left breast is status post lumpectomy. Mildly tender to palpation along the incision line, but no suspicious nodularities. No skin changes. No evidence  of local recurrence. Axillae are benign bilaterally, with no palpable lymphadenopathy. LUNGS:  Clear to auscultation bilaterally.  No wheezes or rhonchi HEART:  Regular rate and rhythm. No murmur appreciated. ABDOMEN:  Soft, nontender.  Positive bowel sounds.  MSK:  No focal spinal tenderness to palpation. Good range of motion bilaterally in the upper extremities. EXTREMITIES:  No peripheral edema.  No lymphedema in the left upper extremity SKIN:  No visible rashes. No ecchymoses or petechiae. No pallor. Skin turgor is good. NEURO:  Nonfocal. Well oriented.  Appropriate affect.   LAB RESULTS: Lab Results  Component Value Date   WBC 5.3 10/17/2012   NEUTROABS 3.2 10/17/2012   HGB 12.9 10/17/2012   HCT 38.9 10/17/2012   MCV 87.1 10/17/2012   PLT 189 10/17/2012      Chemistry      Component Value Date/Time   NA 141 10/17/2012 1020   NA 140 07/17/2011 1056   K 4.1 10/17/2012 1020   K 4.2 07/17/2011 1056   CL 109* 04/04/2012 1003   CL 106 07/17/2011 1056   CO2 24 10/17/2012 1020   CO2 23 07/17/2011 1056  BUN 13.5 10/17/2012 1020   BUN 18 07/17/2011 1056   CREATININE 0.7 10/17/2012 1020   CREATININE 0.69 07/17/2011 1056      Component Value Date/Time   CALCIUM 8.9 10/17/2012 1020   CALCIUM 9.4 07/17/2011 1056   ALKPHOS 53 10/17/2012 1020   ALKPHOS 75 07/17/2011 1056   AST 18 10/17/2012 1020   AST 23 07/17/2011 1056   ALT 13 10/17/2012 1020   ALT 23 07/17/2011 1056   BILITOT 0.28 10/17/2012 1020   BILITOT 0.3 07/17/2011 1056       STUDIES:  Most recent bilateral mammogram at [facility] on 03/06/2013 was unremarkable.   ASSESSMENT: 68 y.o. Spanish speaker originally from [place],   (1)  status post left lumpectomy 03/31/2011 for a grade 2 ductal carcinoma in situ, [stage 0], strongly estrogen and progesterone receptor positive, with negative margins;   (2)  status post radiation completed May 2013.  (3)  on tamoxifen since 07/17/2011, the plan being to continue for total of 5  years (total June 2018)  PLAN:  Barbara Washington appears to be doing very well with regards to her breast cancer, and I'm making no changes in her current regimen. I have refilled her tamoxifen for another year, and she will have her next bilateral mammogram at [facility] in February 2016. She'll return to see [place] next year and annually until 2018 when she completes 5 years of followup.   The above plan was reviewed in detail with Barbara Washington today. She voices both her understanding and agreement. She also knows to call us with any changes or problems.   BERRY,AMY  PA-C    07/13/2013

## 2013-07-14 ENCOUNTER — Telehealth: Payer: Self-pay | Admitting: *Deleted

## 2013-07-14 ENCOUNTER — Ambulatory Visit: Payer: Medicare Other

## 2013-07-14 NOTE — Telephone Encounter (Signed)
Called pt's daughter, Dawson Bills  with lab results. Went over lab results in detail with pt's daughter. She was pleased with her mother's results. ALL Labs results were WNL. Message to be forwarded to [employer], PA-C.

## 2014-02-26 ENCOUNTER — Telehealth: Payer: Self-pay

## 2014-02-26 NOTE — Telephone Encounter (Signed)
Rcvd from [place] on switchboard - pt going on vacation Feb-April, needs 3 month supply of Tamoxifen.  Per chart, refills available.  Confirmed with pharmacy.  Attempted to contact patient - no answer, no mailbox to leave message in.

## 2014-03-16 NOTE — Telephone Encounter (Signed)
none

## 2014-07-17 ENCOUNTER — Telehealth: Payer: Self-pay | Admitting: Oncology

## 2014-07-17 ENCOUNTER — Ambulatory Visit (HOSPITAL_BASED_OUTPATIENT_CLINIC_OR_DEPARTMENT_OTHER): Payer: Medicare Other | Admitting: Oncology

## 2014-07-17 ENCOUNTER — Other Ambulatory Visit (HOSPITAL_BASED_OUTPATIENT_CLINIC_OR_DEPARTMENT_OTHER): Payer: Medicare Other

## 2014-07-17 VITALS — BP 134/65 | HR 73 | Temp 98.4°F | Resp 18 | Ht <= 58 in | Wt 142.3 lb

## 2014-07-17 DIAGNOSIS — D0512 Intraductal carcinoma in situ of left breast: Secondary | ICD-10-CM

## 2014-07-17 DIAGNOSIS — C50419 Malignant neoplasm of upper-outer quadrant of unspecified female breast: Secondary | ICD-10-CM

## 2014-07-17 DIAGNOSIS — C50412 Malignant neoplasm of upper-outer quadrant of left female breast: Secondary | ICD-10-CM

## 2014-07-17 DIAGNOSIS — Z853 Personal history of malignant neoplasm of breast: Secondary | ICD-10-CM

## 2014-07-17 LAB — COMPREHENSIVE METABOLIC PANEL (CC13)
ALK PHOS: 57 U/L (ref 40–150)
ALT: 17 U/L (ref 0–55)
AST: 18 U/L (ref 5–34)
Albumin: 3.6 g/dL (ref 3.5–5.0)
Anion Gap: 7 mEq/L (ref 3–11)
BUN: 14.1 mg/dL (ref 7.0–26.0)
CO2: 27 mEq/L (ref 22–29)
Calcium: 8.9 mg/dL (ref 8.4–10.4)
Chloride: 108 mEq/L (ref 98–109)
Creatinine: 0.7 mg/dL (ref 0.6–1.1)
EGFR: 87 mL/min/{1.73_m2} — ABNORMAL LOW (ref >90)
Glucose: 91 mg/dl (ref 70–140)
Potassium: 3.9 mEq/L (ref 3.5–5.1)
Sodium: 141 mEq/L (ref 136–145)
Total Bilirubin: 0.28 mg/dL (ref 0.20–1.20)
Total Protein: 7.5 g/dL (ref 6.4–8.3)

## 2014-07-17 LAB — CBC WITH DIFFERENTIAL/PLATELET
BASO%: 0.2 % (ref 0.0–2.0)
Basophils Absolute: 0 10*3/uL (ref 0.0–0.1)
EOS ABS: 0.7 10*3/uL — AB (ref 0.0–0.5)
EOS%: 11.1 % — AB (ref 0.0–7.0)
HCT: 39.7 % (ref 34.8–46.6)
HGB: 13.2 g/dL (ref 11.6–15.9)
LYMPH%: 32.2 % (ref 14.0–49.7)
MCH: 29.1 pg (ref 25.1–34.0)
MCHC: 33.2 g/dL (ref 31.5–36.0)
MCV: 87.4 fL (ref 79.5–101.0)
MONO#: 0.3 10*3/uL (ref 0.1–0.9)
MONO%: 4.5 % (ref 0.0–14.0)
NEUT#: 3.2 10*3/uL (ref 1.5–6.5)
NEUT%: 52 % (ref 38.4–76.8)
Platelets: 206 10*3/uL (ref 145–400)
RBC: 4.54 10*6/uL (ref 3.70–5.45)
RDW: 13.8 % (ref 11.2–14.5)
WBC: 6.2 10*3/uL (ref 3.9–10.3)
lymph#: 2 10*3/uL (ref 0.9–3.3)

## 2014-07-17 MED ORDER — TAMOXIFEN CITRATE 20 MG PO TABS: ORAL_TABLET | ORAL | Status: DC

## 2014-07-17 NOTE — Telephone Encounter (Signed)
Gave avs. Sent message for Survivorship for one year

## 2014-07-17 NOTE — Progress Notes (Signed)
ID: Barbara Washington   DOB: 02-14-1945  MR#: [id number]  YPP#:[id number]  PCP: Yong Channel, MD GYN:  SURCathleen Fears. MD (in [place]) [place]:  Bronwen Betters, MD (in [place]) OTHER:    CHIEF COMPLAINT:  Hx of Left noninvasive breast cancer  CURRENT TREATMENT: TAMOXIFEN    HISTORY OF PRESENT ILLNESS: From the original intake note:  The patient had routine screening mammography at [facility] 03/16/2011, suggesting new microcalcifications in the left breast. She was recalled for additional views 03/18/2011 and this confirmed a cluster of pleomorphic microcalcifications in the upper outer quadrant of the left breast, measuring approximately 1.2 cm. 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  Because the patient's daughter, who is a physician, lives in [place], the patient went there for definitive treatment, and after appropriate discussion underwent a left lumpectomy 03/31/2011 under Dr.Gwenn Pavlovitz. The pathology from this procedure [facility] [id number]) showed an intermediate grade ductal carcinoma in situ measuring 9 mm, with negative margins (closest 4 mm), with Allred scores of 8 for both estrogen and progesterone receptor positivity. Postoperatively the patient received radiation to the left breast under Dr. Gwyndolyn Saxon Pao (50 gray to the breast and 10 gray he a boost to the scar) completed 06/19/2011.  The patient met with Dr.Corey Shamah to discuss systemic adjuvant therapy, and Dr. Angelena Sole recommended tamoxifen. The patient however has not get started that medication. She has returned to the [place] area and established herself in our practice 07/17/2011.   Her subsequent history is as detailed below   INTERVAL HISTORY: Vi returns today for follow-up of her noninvasive breast cancer. The interval history is generally unremarkable. She continues on tamoxifen. She  obtains it at a very good price. Hot flashes and vaginal wetness are not a problem.  REVIEW OF SYSTEMS: Latina recently resumed an aggressive exercise program at [employer]. She thinks she may have overdone it as far as her left shoulder and left arm is concerned. There has been no lymphedema however. A detailed review of systems was otherwise entirely negative.  PAST MEDICAL HISTORY: Past Medical History  Diagnosis Date  . DCIS (ductal carcinoma in situ) of breast   . Diverticular disease   . Hypertension   . Mitral valve prolapse   . Hyperlipidemia     PAST SURGICAL HISTORY: Past Surgical History  Procedure Laterality Date  . Total abdominal hysterectomy w/ bilateral salpingoophorectomy    . Cataract extraction, bilateral      FAMILY HISTORY The patient's father died at the age of 35. He had high blood pressure. The patient's mother died at the age of 5, from causes unknown to the patient. The patient has one brother and 4 sisters. There is no breast or ovarian cancer or indeed any other cancer in the family to her knowledge.  GYNECOLOGIC HISTORY: (Reviewed 07/13/2013) She had menarche age 89. She underwent hysterectomy January of 1998. She took hormone placement for 1 year. The patient is GX P2, first pregnancy to term age 16  SOCIAL HISTORY:  (Reviewed 07/13/2013) Ms. Dolder is chiefly a homemaker. Remotely she did some sewing work. Her husband [name] is a Pensions consultant. They are originally from [place], [place], and have lived in [place] since 2007. Their daughter in [place], [place], is a physician, although currently not practicing. [Phone/fax = [phone]] She has 2 children. Their son [name] in [place] works in Engineer, technical sales. He has one child. The  patient is a Nurse, learning disability   ADVANCED DIRECTIVES: not in place  HEALTH MAINTENANCE: History  Substance Use Topics  . Smoking status: Never Smoker   . Smokeless tobacco: Never Used  . Alcohol Use: 0.0 - 0.5  oz/week    0-1 drink(s) per week     Colonoscopy: 2009  PAP: 2013  Bone density: 2013  Lipid panel: per Dr. Karle Starch  Allergies  Allergen Reactions  . Tape     Current Outpatient Prescriptions  Medication Sig Dispense Refill  . aspirin 81 MG tablet Take 81 mg by mouth daily.    Marland Kitchen losartan (COZAAR) 25 MG tablet Take 25 mg by mouth daily.    . miconazole nitrate (EQL MICONAZOLE 3) 200-2 MG-% (9GM) KIT aplique al area irritada diariamente mientras necesario 1 each 6  . naproxen sodium (ANAPROX) 220 MG tablet Take 220 mg by mouth 2 (two) times daily with a meal.    . tamoxifen (NOLVADEX) 20 MG tablet TAKE ONE TABLET BY MOUTH EVERY DAY 90 tablet 3   No current facility-administered medications for this visit.    OBJECTIVE: Middle-aged Latin woman in no acute distress Filed Vitals:   07/17/14 1420  BP: 134/65  Pulse: 73  Temp: 98.4 F (36.9 C)  Resp: 18     Body mass index is 29.75 kg/(m^2).    ECOG FS: 0 Filed Weights   07/17/14 1420  Weight: 142 lb 4.8 oz (64.547 kg)   Sclerae unicteric, pupils round and equal Oropharynx clear and moist-- no thrush or other lesions No cervical or supraclavicular adenopathy Lungs no rales or rhonchi Heart regular rate and rhythm Abd soft, nontender, positive bowel sounds MSK no focal spinal tenderness, no upper extremity lymphedema, no masses or significant change in the left shoulder, with good range of motion the left upper extremity Neuro: nonfocal, well oriented, positive affect Breasts: The right breast is unremarkable. The left breast is status post lumpectomy and radiation. There is no evidence of local recurrence. The left axilla is benign.    LAB RESULTS: Lab Results  Component Value Date   WBC 6.2 07/17/2014   NEUTROABS 3.2 07/17/2014   HGB 13.2 07/17/2014   HCT 39.7 07/17/2014   MCV 87.4 07/17/2014   PLT 206 07/17/2014      Chemistry      Component Value Date/Time   NA 141 07/13/2013 1600   NA 140 07/17/2011 1056    K 3.9 07/13/2013 1600   K 4.2 07/17/2011 1056   CL 109* 04/04/2012 1003   CL 106 07/17/2011 1056   CO2 25 07/13/2013 1600   CO2 23 07/17/2011 1056   BUN 17.2 07/13/2013 1600   BUN 18 07/17/2011 1056   CREATININE 0.7 07/13/2013 1600   CREATININE 0.69 07/17/2011 1056      Component Value Date/Time   CALCIUM 9.2 07/13/2013 1600   CALCIUM 9.4 07/17/2011 1056   ALKPHOS 60 07/13/2013 1600   ALKPHOS 75 07/17/2011 1056   AST 22 07/13/2013 1600   AST 23 07/17/2011 1056   ALT 24 07/13/2013 1600   ALT 23 07/17/2011 1056   BILITOT 0.26 07/13/2013 1600   BILITOT 0.3 07/17/2011 1056       STUDIES:  Bilateral diagnostic mammography at [facility] 03/07/2014 showed a breast density category a. There was no evidence of recurrent or new disease.  ASSESSMENT: 69 y.o. Spanish speaker originally from [place], [place],   (1)  status post left lumpectomy 03/31/2011 for a grade 2 ductal carcinoma in situ, [stage 0],  strongly estrogen and progesterone receptor positive, with negative margins;   (2)  status post radiation completed May 2013.  (3)  on tamoxifen since 07/17/2011, the plan being to continue for total of 5 years (total June 2018)  PLAN:  Fidencia is now 3 years out from definitive surgery for her noninvasive breast cancer. There is no evidence of disease recurrence. This is very favorable.  She will continue tamoxifen, and today we refilled that prescription for her. She will see our breast survivorship nurse practitioner in 1 year. She will then see me 2 years from now and at that point she will "graduate" from follow-up here.  She has a good understanding of the overall plan. She agrees with it. She will call with any problems that may develop before her next visit here. Chauncey Cruel, MD     07/17/2014

## 2015-01-11 ENCOUNTER — Other Ambulatory Visit: Payer: Self-pay | Admitting: *Deleted

## 2015-01-11 DIAGNOSIS — C50419 Malignant neoplasm of upper-outer quadrant of unspecified female breast: Secondary | ICD-10-CM

## 2015-01-11 MED ORDER — TAMOXIFEN CITRATE 20 MG PO TABS: ORAL_TABLET | ORAL | Status: DC

## 2015-01-11 NOTE — Telephone Encounter (Signed)
This RN spoke with pt per her call to after hours teamhealth as well as her call earlier today.  Pt is requesting refill of tamoxifen to [facility].  Refill obtained and sent to above pharmacy.

## 2015-07-31 ENCOUNTER — Other Ambulatory Visit: Payer: Self-pay

## 2015-07-31 DIAGNOSIS — C50412 Malignant neoplasm of upper-outer quadrant of left female breast: Secondary | ICD-10-CM

## 2015-08-01 ENCOUNTER — Ambulatory Visit (HOSPITAL_COMMUNITY)
Admission: RE | Admit: 2015-08-01 | Discharge: 2015-08-01 | Disposition: A | Discharge status: Home / Self Care | Payer: Medicare Other | Source: Ambulatory Visit | Attending: Oncology | Admitting: Oncology

## 2015-08-01 ENCOUNTER — Other Ambulatory Visit: Payer: Self-pay | Admitting: *Deleted

## 2015-08-01 ENCOUNTER — Ambulatory Visit (HOSPITAL_BASED_OUTPATIENT_CLINIC_OR_DEPARTMENT_OTHER): Payer: Medicare Other | Admitting: Oncology

## 2015-08-01 ENCOUNTER — Other Ambulatory Visit (HOSPITAL_BASED_OUTPATIENT_CLINIC_OR_DEPARTMENT_OTHER): Payer: Medicare Other

## 2015-08-01 ENCOUNTER — Telehealth: Payer: Self-pay | Admitting: Oncology

## 2015-08-01 VITALS — BP 126/72 | HR 70 | Temp 98.1°F | Resp 18 | Ht <= 58 in | Wt 146.7 lb

## 2015-08-01 DIAGNOSIS — C50412 Malignant neoplasm of upper-outer quadrant of left female breast: Secondary | ICD-10-CM | POA: Diagnosis present

## 2015-08-01 DIAGNOSIS — Z170 Estrogen receptor positive status [ER+]: Secondary | ICD-10-CM | POA: Diagnosis not present

## 2015-08-01 DIAGNOSIS — R05 Cough: Secondary | ICD-10-CM | POA: Insufficient documentation

## 2015-08-01 DIAGNOSIS — D0512 Intraductal carcinoma in situ of left breast: Secondary | ICD-10-CM

## 2015-08-01 DIAGNOSIS — C50419 Malignant neoplasm of upper-outer quadrant of unspecified female breast: Secondary | ICD-10-CM

## 2015-08-01 LAB — CBC WITH DIFFERENTIAL/PLATELET
BASO%: 0.5 % (ref 0.0–2.0)
BASOS ABS: 0 10*3/uL (ref 0.0–0.1)
EOS%: 14.6 % — AB (ref 0.0–7.0)
Eosinophils Absolute: 1.1 10*3/uL — ABNORMAL HIGH (ref 0.0–0.5)
HCT: 39.3 % (ref 34.8–46.6)
HGB: 13 g/dL (ref 11.6–15.9)
LYMPH#: 2 10*3/uL (ref 0.9–3.3)
LYMPH%: 26.9 % (ref 14.0–49.7)
MCH: 28.5 pg (ref 25.1–34.0)
MCHC: 32.9 g/dL (ref 31.5–36.0)
MCV: 86.6 fL (ref 79.5–101.0)
MONO#: 0.5 10*3/uL (ref 0.1–0.9)
MONO%: 6.9 % (ref 0.0–14.0)
NEUT#: 3.8 10*3/uL (ref 1.5–6.5)
NEUT%: 51.1 % (ref 38.4–76.8)
Platelets: 226 10*3/uL (ref 145–400)
RBC: 4.54 10*6/uL (ref 3.70–5.45)
RDW: 13.4 % (ref 11.2–14.5)
WBC: 7.4 10*3/uL (ref 3.9–10.3)

## 2015-08-01 LAB — COMPREHENSIVE METABOLIC PANEL
ALT: 27 U/L (ref 0–55)
ANION GAP: 11 meq/L (ref 3–11)
AST: 24 U/L (ref 5–34)
Albumin: 3.4 g/dL — ABNORMAL LOW (ref 3.5–5.0)
Alkaline Phosphatase: 69 U/L (ref 40–150)
BUN: 12.8 mg/dL (ref 7.0–26.0)
CALCIUM: 9.2 mg/dL (ref 8.4–10.4)
CHLORIDE: 107 meq/L (ref 98–109)
CO2: 24 mEq/L (ref 22–29)
Creatinine: 0.7 mg/dL (ref 0.6–1.1)
EGFR: 82 mL/min/{1.73_m2} — AB (ref >90)
Glucose: 111 mg/dl (ref 70–140)
POTASSIUM: 4 meq/L (ref 3.5–5.1)
Sodium: 141 mEq/L (ref 136–145)
Total Bilirubin: <0.3 mg/dL (ref 0.20–1.20)
Total Protein: 7.7 g/dL (ref 6.4–8.3)

## 2015-08-01 MED ORDER — TAMOXIFEN CITRATE 20 MG PO TABS: ORAL_TABLET | ORAL | Status: DC

## 2015-08-01 MED ORDER — OMEPRAZOLE 40 MG PO CPDR: 40.0000 mg | DELAYED_RELEASE_CAPSULE | Freq: Every day | ORAL | Status: DC

## 2015-08-01 NOTE — Progress Notes (Signed)
ID: Barbara Washington   DOB: January 10, 1946  MR#: [id number]  YSA#:[id number]  PCP: Yong Channel, MD GYN:  SURCathleen Fears. MD (in [place]) [place]:  Bronwen Betters, MD (in [place]) OTHER:    CHIEF COMPLAINT:  Hx of Left noninvasive breast cancer  CURRENT TREATMENT: TAMOXIFEN    HISTORY OF PRESENT ILLNESS: From the original intake note:  The patient had routine screening mammography at [facility] 03/16/2011, suggesting new microcalcifications in the left breast. She was recalled for additional views 03/18/2011 and this confirmed a cluster of pleomorphic microcalcifications in the upper outer quadrant of the left breast, measuring approximately 1.2 cm. 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  Because the patient's daughter, who is a physician, lives in [place], the patient went there for definitive treatment, and after appropriate discussion underwent a left lumpectomy 03/31/2011 under Dr.Gwenn Pavlovitz. The pathology from this procedure [facility] [id number]) showed an intermediate grade ductal carcinoma in situ measuring 9 mm, with negative margins (closest 4 mm), with Allred scores of 8 for both estrogen and progesterone receptor positivity. Postoperatively the patient received radiation to the left breast under Dr. Gwyndolyn Saxon Pao (50 gray to the breast and 10 gray he a boost to the scar) completed 06/19/2011.  The patient met with Dr.Corey Shamah to discuss systemic adjuvant therapy, and Dr. Angelena Sole recommended tamoxifen. The patient however has not get started that medication. She has returned to the [place] area and established herself in our practice 07/17/2011.   Her subsequent history is as detailed below   INTERVAL HISTORY: [name] returns today for follow-up of her ductal carcinoma in situ. From a breast cancer point of view she is doing fine. She just had a mammogram in  February which was unremarkable. She continues on tamoxifen with no side effects from that medication that she is aware of.  REVIEW OF SYSTEMS: Barbara Washington has had what she calls a cold all the way from November through April. She tells me she has a persistent dry cough. She has not had any fever or purulence. He has tried over-the-counter antitussives without success. She tells me her doctor got a chest x-ray in February didn't show any major problems. Aside from this issue she has a little bit of stress urinary incontinence particularly when she coughs. She also has some arthritis in her hands. A detailed review of systems was otherwise negative PAST MEDICAL HISTORY: Past Medical History  Diagnosis Date  . DCIS (ductal carcinoma in situ) of breast   . Diverticular disease   . Hypertension   . Mitral valve prolapse   . Hyperlipidemia     PAST SURGICAL HISTORY: Past Surgical History  Procedure Laterality Date  . Total abdominal hysterectomy w/ bilateral salpingoophorectomy    . Cataract extraction, bilateral      FAMILY HISTORY The patient's father died at the age of 54. He had high blood pressure. The patient's mother died at the age of 47, from causes unknown to the patient. The patient has one brother and 4 sisters. There is no breast or ovarian cancer or indeed any other cancer in the family to her knowledge.  GYNECOLOGIC HISTORY: (Reviewed 07/13/2013) She had menarche age 45. She underwent hysterectomy January of 1998. She took hormone placement for 1 year. The patient is GX P2, first pregnancy to term age 5  SOCIAL HISTORY:  (Reviewed 07/13/2013) [name] is chiefly a homemaker. Remotely she did some sewing  work. Her husband Barbara Washington is a Pensions consultant. They are originally from [place], [place], and have lived in [place] since 2007. Their daughter in [place], Barbara Washington, is a physician, although currently not practicing. [Phone/fax = [phone]] She has 2  children. Their son Barbara Washington in [place] works in Engineer, technical sales. He has one child. The patient is a Nurse, learning disability   ADVANCED DIRECTIVES: not in place  HEALTH MAINTENANCE: Social History  Substance Use Topics  . Smoking status: Never Smoker   . Smokeless tobacco: Never Used  . Alcohol Use: 0.0 - 0.5 oz/week    0-1 drink(s) per week     Colonoscopy: 2009  PAP: 2013  Bone density: 2013  Lipid panel: per Dr. Karle Starch  Allergies  Allergen Reactions  . Tape     Current Outpatient Prescriptions  Medication Sig Dispense Refill  . aspirin 81 MG tablet Take 81 mg by mouth daily.    Marland Kitchen losartan (COZAAR) 25 MG tablet Take 25 mg by mouth daily.    . miconazole nitrate (EQL MICONAZOLE 3) 200-2 MG-% (9GM) KIT aplique al area irritada diariamente mientras necesario 1 each 6  . naproxen sodium (ANAPROX) 220 MG tablet Take 220 mg by mouth 2 (two) times daily with a meal.    . tamoxifen (NOLVADEX) 20 MG tablet TAKE ONE TABLET BY MOUTH EVERY DAY 90 tablet 3   No current facility-administered medications for this visit.    OBJECTIVE: Middle-aged Latin woman Who appears stated age  70 Vitals:   08/01/15 0905  BP: 126/72  Pulse: 70  Temp: 98.1 F (36.7 C)  Resp: 18     Body mass index is 30.67 kg/(m^2).    ECOG FS: 0 Filed Weights   08/01/15 0905  Weight: 146 lb 11.2 oz (66.543 kg)   Sclerae unicteric, EOMs intact Oropharynx clear and moist No cervical or supraclavicular adenopathy Lungs no rales or rhonchi Heart regular rate and rhythm Abd soft, nontender, positive bowel sounds MSK no focal spinal tenderness, no upper extremity lymphedema Neuro: nonfocal, well oriented, appropriate affect Breasts: The right breast is unremarkable. The left breast is status post lumpectomy followed by radiation. There is no evidence of local recurrence. The left axilla is benign. Area     LAB RESULTS: Lab Results  Component Value Date   WBC 7.4 08/01/2015   NEUTROABS 3.8 08/01/2015   HGB 13.0 08/01/2015   HCT  39.3 08/01/2015   MCV 86.6 08/01/2015   PLT 226 08/01/2015      Chemistry      Component Value Date/Time   NA 141 08/01/2015 0840   NA 140 07/17/2011 1056   K 4.0 08/01/2015 0840   K 4.2 07/17/2011 1056   CL 109* 04/04/2012 1003   CL 106 07/17/2011 1056   CO2 24 08/01/2015 0840   CO2 23 07/17/2011 1056   BUN 12.8 08/01/2015 0840   BUN 18 07/17/2011 1056   CREATININE 0.7 08/01/2015 0840   CREATININE 0.69 07/17/2011 1056      Component Value Date/Time   CALCIUM 9.2 08/01/2015 0840   CALCIUM 9.4 07/17/2011 1056   ALKPHOS 69 08/01/2015 0840   ALKPHOS 75 07/17/2011 1056   AST 24 08/01/2015 0840   AST 23 07/17/2011 1056   ALT 27 08/01/2015 0840   ALT 23 07/17/2011 1056   BILITOT <0.30 08/01/2015 0840   BILITOT 0.3 07/17/2011 1056       STUDIES:  Bilateral diagnostic mammography at [facility]02/10/2015 showed a breast density category a. There was no evidence of  recurrent or new disease.  ASSESSMENT: 70 y.o. Spanish speaker originally from [place],   (1)  status post left lumpectomy 03/31/2011 for a grade 2 ductal carcinoma in situ, [stage 0], strongly estrogen and progesterone receptor positive, with negative margins;   (2)  status post radiation completed May 2013.  (3)  on tamoxifen since 07/17/2011, the plan being to continue for total of 5 years (total June 2018)  PLAN:  Lukisha is now a little over 4 years out from her definitive surgery for breast cancer, with no evidence of disease recurrence. This is very favorable.  I think her persistent cough may be due to reflux. She tells me she had a chest x-ray in February, but I do not have a copy of that so we are repeating one today just to make sure she is not developing a primary lung issue.  I have gone ahead and written her for omeprazole to take daily. It would be expected to work over the next several weeks. If she still having a cough after 4 weeks then she probably needs a pulmonologist evaluation. If  however the cough resolves then most likely it was due to occult reflux and she can continue the omeprazole indefinitely.  She will continue the tamoxifen another year. She will "graduate" from follow-up here after her next visit. She knows to call for any other problems that may develop before then. Chauncey Cruel, MD     08/01/2015

## 2015-08-01 NOTE — Telephone Encounter (Signed)
appt made and avs printed °

## 2015-08-18 ENCOUNTER — Ambulatory Visit (HOSPITAL_COMMUNITY)
Admission: EM | Admit: 2015-08-18 | Discharge: 2015-08-18 | Disposition: A | Discharge status: Home / Self Care | Payer: Medicare Other | Attending: Family Medicine | Admitting: Family Medicine

## 2015-08-18 ENCOUNTER — Encounter (HOSPITAL_COMMUNITY): Payer: Self-pay | Admitting: Family Medicine

## 2015-08-18 DIAGNOSIS — T783XXA Angioneurotic edema, initial encounter: Secondary | ICD-10-CM

## 2015-08-18 MED ORDER — METHYLPREDNISOLONE SODIUM SUCC 125 MG IJ SOLR: 125.0000 mg | Freq: Once | INTRAMUSCULAR | Status: DC

## 2015-08-18 MED ORDER — EPINEPHRINE HCL 1 MG/ML IJ SOLN
0.3000 mg | Freq: Once | INTRAMUSCULAR | Status: AC
  Administered 2015-08-18: 0.3 mg via INTRAMUSCULAR

## 2015-08-18 MED ORDER — DIPHENHYDRAMINE HCL 50 MG/ML IJ SOLN
25.0000 mg | Freq: Once | INTRAMUSCULAR | Status: AC
Start: 2015-08-18 — End: 2015-08-18
  Administered 2015-08-18: 25 mg via INTRAVENOUS

## 2015-08-18 MED ORDER — EPINEPHRINE HCL 1 MG/ML IJ SOLN
INTRAMUSCULAR | Status: AC
  Filled 2015-08-18: qty 1

## 2015-08-18 MED ORDER — DIPHENHYDRAMINE HCL 50 MG/ML IJ SOLN
INTRAMUSCULAR | Status: AC
  Filled 2015-08-18: qty 1

## 2015-08-18 NOTE — ED Provider Notes (Signed)
CSN: [id number]     Arrival date & time 08/18/15  1826 History   None    Chief Complaint  Patient presents with  . Allergic Reaction   (Consider location/radiation/quality/duration/timing/severity/associated sxs/prior Treatment) Patient presents today for angioedema started at 2pm today. She reports to have swollen upper lips and a sensation of "something stuck in my throat" when she swallows. However she denies shortness of breath, denies feeling her throat closing up, denies her eyes being swollen and denies any rash. Patient also denies taking ACEI. She denies new food intake. She also denies any new medications. She took '50mg'$  oral benadryl at 4pm prior to arrival.       Past Medical History:  Diagnosis Date  . DCIS (ductal carcinoma in situ) of breast   . Diverticular disease   . Hyperlipidemia   . Hypertension   . Mitral valve prolapse    Past Surgical History:  Procedure Laterality Date  . CATARACT EXTRACTION, BILATERAL    . TOTAL ABDOMINAL HYSTERECTOMY W/ BILATERAL SALPINGOOPHORECTOMY     History reviewed. No pertinent family history. Social History  Substance Use Topics  . Smoking status: Never Smoker  . Smokeless tobacco: Never Used  . Alcohol use 0.0 - 0.5 oz/week    0 - 1 drink(s) per week   OB History    No data available     Review of Systems  Constitutional:       Denies feeling of impending doom  HENT: Positive for facial swelling. Negative for drooling.        Positive for upper lip swelling, sensation of something stuck in her throat when she swallows  Respiratory: Negative for cough, shortness of breath and wheezing.   Cardiovascular: Negative for chest pain and palpitations.  Neurological: Negative for dizziness, syncope, weakness and light-headedness.    Allergies  Tape  Home Medications   Prior to Admission medications   Medication Sig Start Date End Date Taking? Authorizing Provider  aspirin 81 MG tablet Take 81 mg by mouth daily.     Historical Provider, MD  losartan (COZAAR) 25 MG tablet Take 25 mg by mouth daily.    Historical Provider, MD  miconazole nitrate (EQL MICONAZOLE 3) 200-2 MG-% (9GM) KIT aplique al area irritada diariamente mientras necesario 04/11/12   Chauncey Cruel, MD  naproxen sodium (ANAPROX) 220 MG tablet Take 220 mg by mouth 2 (two) times daily with a meal.    Historical Provider, MD  omeprazole (PRILOSEC) 40 MG capsule Take 1 capsule (40 mg total) by mouth daily. 08/01/15   Chauncey Cruel, MD  tamoxifen (NOLVADEX) 20 MG tablet TAKE ONE TABLET BY MOUTH EVERY DAY 08/01/15   Chauncey Cruel, MD   Meds Ordered and Administered this Visit   Medications  EPINEPHrine (ADRENALIN) injection 0.3 mg (0.3 mg Intramuscular Given 08/18/15 1857)  diphenhydrAMINE (BENADRYL) injection 25 mg (25 mg Intravenous Given 08/18/15 1859)    BP 157/73   Pulse 84   Temp 98.1 F (36.7 C)   Resp 16   SpO2 96%  No data found.   Physical Exam  Constitutional: She is oriented to person, place, and time. She appears well-developed and well-nourished.  HENT:  Head: Normocephalic.  Negative for uvula edema. Upper lip swelling present and the swelling was obvious.   Eyes: Conjunctivae and EOM are normal. Pupils are equal, round, and reactive to light.  No swelling  Neck: Neck supple. No tracheal deviation present.  Cardiovascular: Normal rate, regular rhythm and normal heart  sounds.   Pulmonary/Chest: Effort normal and breath sounds normal. No stridor. No respiratory distress. She has no wheezes.  No stridor present as well.   Lymphadenopathy:    She has no cervical adenopathy.  Neurological: She is alert and oriented to person, place, and time.  Skin: Skin is warm and dry.    Urgent Care Course   Clinical Course  Comment By Time  Patient reports feeling better, lip swelling has reduced, throat feels better as well  Barry Dienes, NP 07/23 1954    Procedures (including critical care time)  Labs Review Labs  Reviewed - No data to display  Imaging Review No results found.   Visual Acuity Review  Right Eye Distance:   Left Eye Distance:   Bilateral Distance:    Right Eye Near:   Left Eye Near:    Bilateral Near:         MDM   1. Angioedema, initial encounter    Patient presents today for angioedema that started at 2pm today and she took 50 mg of benadryl at 4pm prior to arrival. She denies taking an ACE Inhibitor, or any other new medications. The exact trigger for her reaction is unknown. She felt better after being treated with benadryl and epinephrine. At the time of discharge, her lip swelling has reduced and patient no longer has the "something stuck in throat" sensation when she swallows. She was also able to swallow liquid. Patient instructed to take another 59m of benadryl at bedtime. Instructed to seek emergency care if she worsens.  Patient understands the plan of care.       FBarry Dienes NP 08/19/15 [phone]

## 2015-08-18 NOTE — ED Triage Notes (Signed)
Pt here with sig swelling to lips and tightness in throat that started around 3 pm. Took benadryl. Unsure of cause.

## 2015-11-05 ENCOUNTER — Ambulatory Visit (INDEPENDENT_AMBULATORY_CARE_PROVIDER_SITE_OTHER): Payer: Medicare Other | Admitting: Gynecology

## 2015-11-05 ENCOUNTER — Encounter: Payer: Self-pay | Admitting: Gynecology

## 2015-11-05 VITALS — BP 130/70 | Ht <= 58 in | Wt 145.0 lb

## 2015-11-05 DIAGNOSIS — Z87410 Personal history of cervical dysplasia: Secondary | ICD-10-CM

## 2015-11-05 DIAGNOSIS — Z01419 Encounter for gynecological examination (general) (routine) without abnormal findings: Secondary | ICD-10-CM | POA: Diagnosis not present

## 2015-11-05 DIAGNOSIS — Z853 Personal history of malignant neoplasm of breast: Secondary | ICD-10-CM | POA: Diagnosis not present

## 2015-11-05 DIAGNOSIS — Z23 Encounter for immunization: Secondary | ICD-10-CM | POA: Diagnosis not present

## 2015-11-05 NOTE — Progress Notes (Signed)
Barbara Washington 1945/08/02 [id number]   History:    70 y.o.  for annual gyn exam who is a new patient to the practice was referred to our practice as a courtesy of her PCP Dr. Karle Starch for GYN exam. Patient also has been followed by the medical oncologist Dr. Jana Hakim evidence is post left lumpectomy 03/31/2011 for a grade 2 ductal carcinoma in situ, [stage 0], strongly estrogen and progesterone receptor positive, with negative margins; she is status post radiation therapy in May 2013 and had been started on tamoxifen in June 2013 and she finished 5 years this past June. Her medical oncologist recommended she continue on the tamoxifen for one more year. She was asymptomatic today. She did state that many years ago she had a total abdominal hysterectomy with bilateral salpingo-oophorectomy because of bleeding issues an abnormal Pap smear.  Patient stated she had a bone density study 4 years ago at her mammogram this year was done which was normal. She also stated she had a colonoscopy 2 years ago. We do not have those reports.  Past medical history,surgical history, family history and social history were all reviewed and documented in the EPIC chart.  Gynecologic History No LMP recorded. Patient has had a hysterectomy. Contraception: status post hysterectomy Last Pap: Several years ago. Results were: Patient reports it was normal Last mammogram: 2017. Results were: normal  Obstetric History OB History  Gravida Para Term Preterm AB Living  2 2       2   SAB TAB Ectopic Multiple Live Births               # Outcome Date GA Lbr Len/2nd Weight Sex Delivery Anes PTL Lv  2 Para           1 Para                ROS: A ROS was performed and pertinent positives and negatives are included in the history.  GENERAL: No fevers or chills. HEENT: No change in vision, no earache, sore throat or sinus congestion. NECK: No pain or stiffness. CARDIOVASCULAR: No chest pain or pressure. No palpitations.  PULMONARY: No shortness of breath, cough or wheeze. GASTROINTESTINAL: No abdominal pain, nausea, vomiting or diarrhea, melena or bright red blood per rectum. GENITOURINARY: No urinary frequency, urgency, hesitancy or dysuria. MUSCULOSKELETAL: No joint or muscle pain, no back pain, no recent trauma. DERMATOLOGIC: No rash, no itching, no lesions. ENDOCRINE: No polyuria, polydipsia, no heat or cold intolerance. No recent change in weight. HEMATOLOGICAL: No anemia or easy bruising or bleeding. NEUROLOGIC: No headache, seizures, numbness, tingling or weakness. PSYCHIATRIC: No depression, no loss of interest in normal activity or change in sleep pattern.     Exam: chaperone present  BP 130/70   Ht 4\' 10"  (1.473 m)   Wt 145 lb (65.8 kg)   BMI 30.31 kg/m   Body mass index is 30.31 kg/m.  General appearance : Well developed well nourished female. No acute distress HEENT: Eyes: no retinal hemorrhage or exudates,  Neck supple, trachea midline, no carotid bruits, no thyroidmegaly Lungs: Clear to auscultation, no rhonchi or wheezes, or rib retractions  Heart: Regular rate and rhythm, no murmurs or gallops Breast:Examined in sitting and supine position were symmetrical in appearance, no palpable masses or tenderness,  no skin retraction, no nipple inversion, no nipple discharge, no skin discoloration, no axillary or supraclavicular lymphadenopathy Abdomen: no palpable masses or tenderness, no rebound or guarding Extremities: no edema or skin discoloration or  tenderness  Pelvic:  Bartholin, Urethra, Skene Glands: Within normal limits             Vagina: No gross lesions or discharge, atrophic changes  Cervix: Absent  Uterus  absent  Adnexa  Without masses or tenderness  Anus and perineum  normal   Rectovaginal  normal sphincter tone without palpated masses or tenderness             Hemoccult PCP provides     Assessment/Plan:  70 y.o. female for annual exam with personal history of breast cancer  starting her 6 year on tamoxifen with prior history of total abdominal hysterectomy bilateral salpingo-oophorectomy was asymptomatic today. Since we do not have any records of her past Pap smear we did do a Pap smear today. We did discuss the ASCCP guidelines whereby she will not need any further Pap smears. She was reminded to check with her PCP to schedule her bone density study. She did receive the flu vaccine today. She was counseled before the injection. We discussed importance of calcium vitamin D and weightbearing exercises for osteoporosis prevention.   Terrance Mass MD, 12:45 PM 11/05/2015

## 2015-11-05 NOTE — Patient Instructions (Signed)
Influenza Virus Vaccine (Flucelvax) Qu es este medicamento? La VACUNA ANTIGRIPAL ayuda a disminuir el riesgo de contraer la influenza, tambin conocida como la gripe. La vacuna solo ayuda a protegerle contra algunas cepas de influenza. Este medicamento puede ser utilizado para otros usos; si tiene alguna pregunta consulte con su proveedor de atencin mdica o con su farmacutico. Qu le debo informar a mi profesional de la salud antes de tomar este medicamento? Necesita saber si usted presenta alguno de los siguientes problemas o situaciones: -trastorno de sangrado como hemofilia -fiebre o infeccin -sndrome de Guillain-Barre u otros problemas neurolgicos -problemas del sistema inmunolgico -infeccin por el virus de la inmunodeficiencia humana (VIH) o SIDA -niveles bajos de plaquetas en la sangre -esclerosis mltiple -una reaccin alrgica o inusual a las vacunas antigripales, a otros medicamentos, alimentos, colorantes o conservantes -si est embarazada o buscando quedar embarazada -si est amamantando a un beb Cmo debo utilizar este medicamento? Esta vacuna se administra mediante inyeccin por va intramuscular. Lo administra un profesional de [employer]	[place]. Recibir una copia de informacin escrita sobre la vacuna antes de cada vacuna. Asegrese de leer este folleto cada vez cuidadosamente. Este folleto puede cambiar con frecuencia. Hable con su pediatra para informarse acerca del uso de este medicamento en nios. Puede requerir atencin especial. Sobredosis: Pngase en contacto inmediatamente con un centro toxicolgico o una sala de urgencia si usted cree que haya tomado demasiado medicamento. ATENCIN: [employer] es solo para usted. No comparta este medicamento con nadie. Qu sucede si me olvido de una dosis? No se aplica en este caso. Qu puede interactuar con este medicamento? -quimioterapia o radioterapia -medicamentos que suprimen el sistema inmunolgico, tales como  etanercept, anakinra, infliximab y adalimumab -medicamentos que tratan o previenen cogulos sanguneos, como warfarina -fenitona -medicamentos esteroideos, como la prednisona o la cortisona -teofilina -vacunas Puede ser que esta lista no menciona todas las posibles interacciones. Informe a su profesional de [employer]	[place] de [employer] productos a base de hierbas, medicamentos de [place] o suplementos nutritivos que est tomando. Si usted fuma, consume bebidas alcohlicas o si utiliza drogas ilegales, indqueselo tambin a su profesional de [employer]	[place]. Algunas sustancias pueden interactuar con su medicamento. A qu debo estar atento al usar [employer]? Informe a su mdico o a Barrister's clerk de la [employer] todos los efectos secundarios que persistan despus de 3 das. Llame a su proveedor de atencin mdica si se presentan sntomas inusuales dentro de las 6 semanas de recibir esta vacuna. Es posible que todava pueda contraer la gripe, pero la enfermedad no ser tan fuerte como normalmente. No puede contraer la gripe de esta vacuna. La vacuna antigripal no le protege contra resfros u otras enfermedades que pueden causar [place]. Debe vacunarse cada ao. Qu efectos secundarios puedo tener al [employer] este medicamento? Efectos secundarios que debe informar a su mdico o a Barrister's clerk de la salud tan pronto como sea posible: -reacciones alrgicas como erupcin cutnea, picazn o urticarias, hinchazn de la cara, labios o lengua Efectos secundarios que, por lo general, no requieren atencin mdica (debe informarlos a su mdico o a su profesional de la salud si persisten o si son molestos): -fiebre -dolor de cabeza -molestias y dolores musculares -dolor, sensibilidad, enrojecimiento o Estate agent de la inyeccin -cansancio Puede ser que esta lista no menciona todos los posibles efectos secundarios. Comunquese a su mdico por asesoramiento mdico [employer]. Usted  puede informar los efectos secundarios a la FDA por telfono al 1-800-FDA-1088. Dnde  debo guardar mi medicina? Esta vacuna se administrar por un profesional de la salud en una [place], Engineer, mining, consultorio mdico u otro consultorio de un profesional de la salud. No se le suministrar esta vacuna para guardar en su domicilio. ATENCIN: Este folleto es un resumen. Puede ser que no cubra toda la posible informacin. Si usted tiene preguntas acerca de esta medicina, consulte con su mdico, su farmacutico o su profesional de Technical sales engineer.    2016, Elsevier/Gold Standard. (2010-12-29 16:29:16)

## 2015-11-06 LAB — PAP IG W/ RFLX HPV ASCU

## 2015-11-25 ENCOUNTER — Ambulatory Visit: Payer: Self-pay | Admitting: *Deleted

## 2016-04-04 ENCOUNTER — Other Ambulatory Visit: Payer: Self-pay | Admitting: Oncology

## 2016-04-04 DIAGNOSIS — C50419 Malignant neoplasm of upper-outer quadrant of unspecified female breast: Secondary | ICD-10-CM

## 2016-05-04 ENCOUNTER — Ambulatory Visit (INDEPENDENT_AMBULATORY_CARE_PROVIDER_SITE_OTHER): Payer: Medicare Other | Admitting: Emergency Medicine

## 2016-05-04 VITALS — BP 142/80 | HR 71 | Temp 98.5°F | Resp 18 | Ht 58.5 in | Wt 142.2 lb

## 2016-05-04 DIAGNOSIS — I10 Essential (primary) hypertension: Secondary | ICD-10-CM

## 2016-05-04 DIAGNOSIS — Z0000 Encounter for general adult medical examination without abnormal findings: Secondary | ICD-10-CM

## 2016-05-04 MED ORDER — ROSUVASTATIN CALCIUM 10 MG PO TABS: 10.0000 mg | ORAL_TABLET | Freq: Every day | ORAL | 3 refills | Status: DC

## 2016-05-04 MED ORDER — TRIAMTERENE-HCTZ 37.5-25 MG PO CAPS: 1.0000 | ORAL_CAPSULE | Freq: Every day | ORAL | 5 refills | Status: DC

## 2016-05-04 NOTE — Progress Notes (Signed)
Barbara Washington 71 y.o.   Chief Complaint  Patient presents with  . Annual Exam    HISTORY OF PRESENT ILLNESS: This is a 71 y.o. female here for annual exam; has been taking Amlodipine since last November but has developed side effects and would like it replaced. She's getting leg edema; ACE/ARB's give her facial edema.  HPI   Prior to Admission medications   Medication Sig Start Date End Date Taking? Authorizing Provider  amLODipine (NORVASC) 5 MG tablet Take 5 mg by mouth daily.   Yes Historical Provider, MD  naproxen sodium (ANAPROX) 220 MG tablet Take 220 mg by mouth 2 (two) times daily with a meal.   Yes Historical Provider, MD  omeprazole (PRILOSEC) 40 MG capsule Take 1 capsule (40 mg total) by mouth daily. 08/01/15  Yes Chauncey Cruel, MD  tamoxifen (NOLVADEX) 20 MG tablet TAKE ONE TABLET BY MOUTH EVERY DAY 08/01/15  Yes Chauncey Cruel, MD  tamoxifen (NOLVADEX) 20 MG tablet TAKE ONE TABLET BY MOUTH ONCE DAILY 04/06/16  Yes Chauncey Cruel, MD  miconazole nitrate (EQL MICONAZOLE 3) 200-2 MG-% (9GM) KIT aplique al area irritada diariamente mientras necesario Patient not taking: Reported on 11/05/2015 04/11/12   Chauncey Cruel, MD    Allergies  Allergen Reactions  . Ibuprofen Nausea And Vomiting  . Tape     Patient Active Problem List   Diagnosis Date Noted  . Breast cancer of upper-outer quadrant of left female breast ([place]) 10/17/2012    Past Medical History:  Diagnosis Date  . Cataract   . DCIS (ductal carcinoma in situ) of breast   . Diverticular disease   . Hyperlipidemia   . Hypertension   . Mitral valve prolapse     Past Surgical History:  Procedure Laterality Date  . ABDOMINAL HYSTERECTOMY  2008   ABDOMINAL HYST - [place]  . BREAST SURGERY     [place]  . CATARACT EXTRACTION, BILATERAL    . EYE SURGERY    . TOTAL ABDOMINAL HYSTERECTOMY W/ BILATERAL SALPINGOOPHORECTOMY      Social History   Social History  . Marital status: Married   Spouse name: N/A  . Number of children: N/A  . Years of education: N/A   Occupational History  . Not on file.   Social History Main Topics  . Smoking status: Never Smoker  . Smokeless tobacco: Never Used  . Alcohol use 0.0 - 0.5 oz/week  . Drug use: No  . Sexual activity: Yes    Birth control/ protection: Surgical   Other Topics Concern  . Not on file   Social History Narrative  . No narrative on file    Family History  Problem Relation Age of Onset  . Hypertension Father   . Hypertension Sister   . Diabetes Brother      Review of Systems  Constitutional: Negative.  Negative for chills, fever and weight loss.  HENT: Negative.  Negative for nosebleeds, sinus pain and sore throat.   Eyes: Negative.  Negative for discharge and redness.  Respiratory: Negative.  Negative for cough and shortness of breath.   Cardiovascular: Positive for leg swelling. Negative for chest pain, palpitations and claudication.  Gastrointestinal: Negative.  Negative for abdominal pain, diarrhea, nausea and vomiting.  Genitourinary: Negative.  Negative for dysuria, flank pain and hematuria.  Musculoskeletal: Negative.  Negative for back pain, myalgias and neck pain.  Skin: Negative.  Negative for rash.  Neurological: Negative.  Negative for dizziness, sensory change, focal weakness  and headaches.  Endo/Heme/Allergies: Negative.   All other systems reviewed and are negative.  Vitals:   05/04/16 0829 05/04/16 0846  BP: (!) 149/81 (!) 142/80  Pulse: 71   Resp: 18   Temp: 98.5 F (36.9 C)      Physical Exam  Constitutional: She is oriented to person, place, and time. She appears well-developed and well-nourished.  HENT:  Head: Normocephalic and atraumatic.  Nose: Nose normal.  Mouth/Throat: Oropharynx is clear and moist. No oropharyngeal exudate.  Eyes: Conjunctivae and EOM are normal. Pupils are equal, round, and reactive to light.  Neck: Normal range of motion. Neck supple. No JVD  present. No thyromegaly present.  Cardiovascular: Normal rate, regular rhythm, normal heart sounds and intact distal pulses.   Pulmonary/Chest: Effort normal and breath sounds normal.  Abdominal: Soft. Bowel sounds are normal. She exhibits no distension. There is no tenderness.  Musculoskeletal: Normal range of motion.  Lymphadenopathy:    She has no cervical adenopathy.  Neurological: She is alert and oriented to person, place, and time. No sensory deficit. She exhibits normal muscle tone.  Skin: Skin is warm and dry. Capillary refill takes less than 2 seconds.  Psychiatric: She has a normal mood and affect. Her behavior is normal.  Vitals reviewed.    ASSESSMENT & PLAN: Will change Amlodipine for Dyazide. [name] was seen today for annual exam.  Diagnoses and all orders for this visit:  Routine general medical examination at a health care facility -     CBC with Differential -     Comprehensive metabolic panel -     Hemoglobin A1c -     Lipid panel -     TSH -     Hepatitis C antibody screen  Essential hypertension  Other orders -     triamterene-hydrochlorothiazide (DYAZIDE) 37.5-25 MG capsule; Take 1 each (1 capsule total) by mouth daily. -     rosuvastatin (CRESTOR) 10 MG tablet; Take 1 tablet (10 mg total) by mouth daily.   Patient Instructions       IF you received an x-ray today, you will receive an invoice from [facility]. Please contact [facility] at [phone] with questions or concerns regarding your invoice.   IF you received labwork today, you will receive an invoice from [facility]. Please contact [facility] at [phone] with questions or concerns regarding your invoice.   Our billing staff will not be able to assist you with questions regarding bills from these companies.  You will be contacted with the lab results as soon as they are available. The fastest way to get your results is to activate your My Chart account. Instructions are  located on the last page of this paperwork. If you have not heard from [place] regarding the results in 2 weeks, please contact this office.       American Heart Association (AHA) Exercise Recommendation  Being physically active is important to prevent heart disease and stroke, the nation's No. 1and No. 5killers. To improve overall cardiovascular health, we suggest at least 150 minutes per week of moderate exercise or 75 minutes per week of vigorous exercise (or a combination of moderate and vigorous activity). Thirty minutes a day, five times a week is an easy goal to remember. You will also experience benefits even if you divide your time into two or three segments of 10 to 15 minutes per day.  For people who would benefit from lowering their blood pressure or cholesterol, we recommend 40 minutes of aerobic  exercise of moderate to vigorous intensity three to four times a week to lower the risk for heart attack and stroke.  Physical activity is anything that makes you move your body and burn calories.  This includes things like climbing stairs or playing sports. Aerobic exercises benefit your heart, and include walking, jogging, swimming or biking. Strength and stretching exercises are best for overall stamina and flexibility.  The simplest, positive change you can make to effectively improve your heart health is to start walking. It's enjoyable, free, easy, social and great exercise. A walking program is flexible and boasts high success rates because people can stick with it. It's easy for walking to become a regular and satisfying part of life.   For Overall Cardiovascular Health:  At least 30 minutes of moderate-intensity aerobic activity at least 5 days per week for a total of 150  OR   At least 25 minutes of vigorous aerobic activity at least 3 days per week for a total of 75 minutes; or a combination of moderate- and vigorous-intensity aerobic activity  AND   Moderate- to  high-intensity muscle-strengthening activity at least 2 days per week for additional health benefits.  For Lowering Blood Pressure and Cholesterol  An average 40 minutes of moderate- to vigorous-intensity aerobic activity 3 or 4 times per week  What if I can't make it to the time goal? Something is always better than nothing! And everyone has to start somewhere. Even if you've been sedentary for years, today is the day you can begin to make healthy changes in your life. If you don't think you'll make it for 30 or 40 minutes, set a reachable goal for today. You can work up toward your overall goal by increasing your time as you get stronger. Don't let all-or-nothing thinking rob you of doing what you can every day.  Source:http://www.heart.Burnadette Pop, MD [facility]

## 2016-05-04 NOTE — Patient Instructions (Addendum)
     IF you received an x-ray today, you will receive an invoice from [facility]. Please contact [facility] at [phone] with questions or concerns regarding your invoice.   IF you received labwork today, you will receive an invoice from [facility]. Please contact [facility] at [phone] with questions or concerns regarding your invoice.   Our billing staff will not be able to assist you with questions regarding bills from these companies.  You will be contacted with the lab results as soon as they are available. The fastest way to get your results is to activate your My Chart account. Instructions are located on the last page of this paperwork. If you have not heard from [place] regarding the results in 2 weeks, please contact this office.       American Heart Association (AHA) Exercise Recommendation  Being physically active is important to prevent heart disease and stroke, the nation's No. 1and No. 5killers. To improve overall cardiovascular health, we suggest at least 150 minutes per week of moderate exercise or 75 minutes per week of vigorous exercise (or a combination of moderate and vigorous activity). Thirty minutes a day, five times a week is an easy goal to remember. You will also experience benefits even if you divide your time into two or three segments of 10 to 15 minutes per day.  For people who would benefit from lowering their blood pressure or cholesterol, we recommend 40 minutes of aerobic exercise of moderate to vigorous intensity three to four times a week to lower the risk for heart attack and stroke.  Physical activity is anything that makes you move your body and burn calories.  This includes things like climbing stairs or playing sports. Aerobic exercises benefit your heart, and include walking, jogging, swimming or biking. Strength and stretching exercises are best for overall stamina and flexibility.  The simplest, positive change you can make to  effectively improve your heart health is to start walking. It's enjoyable, free, easy, social and great exercise. A walking program is flexible and boasts high success rates because people can stick with it. It's easy for walking to become a regular and satisfying part of life.   For Overall Cardiovascular Health:  At least 30 minutes of moderate-intensity aerobic activity at least 5 days per week for a total of 150  OR   At least 25 minutes of vigorous aerobic activity at least 3 days per week for a total of 75 minutes; or a combination of moderate- and vigorous-intensity aerobic activity  AND   Moderate- to high-intensity muscle-strengthening activity at least 2 days per week for additional health benefits.  For Lowering Blood Pressure and Cholesterol  An average 40 minutes of moderate- to vigorous-intensity aerobic activity 3 or 4 times per week  What if I can't make it to the time goal? Something is always better than nothing! And everyone has to start somewhere. Even if you've been sedentary for years, today is the day you can begin to make healthy changes in your life. If you don't think you'll make it for 30 or 40 minutes, set a reachable goal for today. You can work up toward your overall goal by increasing your time as you get stronger. Don't let all-or-nothing thinking rob you of doing what you can every day.  Source:http://www.heart.org

## 2016-05-05 LAB — COMPREHENSIVE METABOLIC PANEL WITH GFR
ALT: 18 IU/L (ref 0–32)
AST: 19 IU/L (ref 0–40)
Albumin/Globulin Ratio: 1.3 (ref 1.2–2.2)
Albumin: 4.1 g/dL (ref 3.5–4.8)
Alkaline Phosphatase: 73 IU/L (ref 39–117)
BUN/Creatinine Ratio: 23 (ref 12–28)
BUN: 14 mg/dL (ref 8–27)
Bilirubin Total: <0.2 mg/dL (ref 0.0–1.2)
CO2: 23 mmol/L (ref 18–29)
Calcium: 9 mg/dL (ref 8.7–10.3)
Chloride: 104 mmol/L (ref 96–106)
Creatinine, Ser: 0.62 mg/dL (ref 0.57–1.00)
GFR calc Af Amer: 106 mL/min/1.73
GFR calc non Af Amer: 92 mL/min/1.73
Globulin, Total: 3.2 g/dL (ref 1.5–4.5)
Glucose: 102 mg/dL — ABNORMAL HIGH (ref 65–99)
Potassium: 3.9 mmol/L (ref 3.5–5.2)
Sodium: 143 mmol/L (ref 134–144)
Total Protein: 7.3 g/dL (ref 6.0–8.5)

## 2016-05-05 LAB — LIPID PANEL
Chol/HDL Ratio: 2.6 ratio (ref 0.0–4.4)
Cholesterol, Total: 176 mg/dL (ref 100–199)
HDL: 69 mg/dL
LDL Calculated: 62 mg/dL (ref 0–99)
Triglycerides: 227 mg/dL — ABNORMAL HIGH (ref 0–149)
VLDL Cholesterol Cal: 45 mg/dL — ABNORMAL HIGH (ref 5–40)

## 2016-05-05 LAB — CBC WITH DIFFERENTIAL/PLATELET
Basophils Absolute: 0 x10E3/uL (ref 0.0–0.2)
Basos: 0 %
EOS (ABSOLUTE): 0.3 x10E3/uL (ref 0.0–0.4)
Eos: 4 %
Hematocrit: 39.6 % (ref 34.0–46.6)
Hemoglobin: 13.1 g/dL (ref 11.1–15.9)
Immature Grans (Abs): 0 x10E3/uL (ref 0.0–0.1)
Immature Granulocytes: 0 %
Lymphocytes Absolute: 2.6 x10E3/uL (ref 0.7–3.1)
Lymphs: 37 %
MCH: 28.4 pg (ref 26.6–33.0)
MCHC: 33.1 g/dL (ref 31.5–35.7)
MCV: 86 fL (ref 79–97)
Monocytes Absolute: 0.2 x10E3/uL (ref 0.1–0.9)
Monocytes: 3 %
Neutrophils Absolute: 4.1 x10E3/uL (ref 1.4–7.0)
Neutrophils: 56 %
Platelets: 263 x10E3/uL (ref 150–379)
RBC: 4.61 x10E6/uL (ref 3.77–5.28)
RDW: 14.6 % (ref 12.3–15.4)
WBC: 7.2 x10E3/uL (ref 3.4–10.8)

## 2016-05-05 LAB — TSH: TSH: 4.87 u[IU]/mL — ABNORMAL HIGH (ref 0.450–4.500)

## 2016-05-05 LAB — HEPATITIS C ANTIBODY: Hep C Virus Ab: <0.1 s/co ratio (ref 0.0–0.9)

## 2016-05-05 LAB — HEMOGLOBIN A1C
Est. average glucose Bld gHb Est-mCnc: 120 mg/dL
Hgb A1c MFr Bld: 5.8 % — ABNORMAL HIGH (ref 4.8–5.6)

## 2016-05-06 ENCOUNTER — Encounter: Payer: Self-pay | Admitting: *Deleted

## 2016-06-01 ENCOUNTER — Ambulatory Visit: Payer: Self-pay | Admitting: *Deleted

## 2016-06-10 ENCOUNTER — Encounter: Payer: Self-pay | Admitting: Gynecology

## 2016-06-30 ENCOUNTER — Telehealth: Payer: Self-pay | Admitting: Oncology

## 2016-06-30 NOTE — Telephone Encounter (Signed)
Spoke with patient and confirmed appointment change.

## 2016-07-01 ENCOUNTER — Ambulatory Visit: Payer: Medicare Other | Admitting: Oncology

## 2016-07-01 ENCOUNTER — Other Ambulatory Visit: Payer: Medicare Other

## 2016-07-13 ENCOUNTER — Other Ambulatory Visit: Payer: Self-pay | Admitting: *Deleted

## 2016-07-13 DIAGNOSIS — C50412 Malignant neoplasm of upper-outer quadrant of left female breast: Secondary | ICD-10-CM

## 2016-07-14 ENCOUNTER — Other Ambulatory Visit (HOSPITAL_BASED_OUTPATIENT_CLINIC_OR_DEPARTMENT_OTHER): Payer: Medicare Other

## 2016-07-14 ENCOUNTER — Ambulatory Visit (HOSPITAL_BASED_OUTPATIENT_CLINIC_OR_DEPARTMENT_OTHER): Payer: Medicare Other | Admitting: Oncology

## 2016-07-14 ENCOUNTER — Telehealth: Payer: Self-pay | Admitting: Oncology

## 2016-07-14 VITALS — BP 140/54 | HR 74 | Temp 98.2°F | Resp 18 | Ht 58.5 in | Wt 139.4 lb

## 2016-07-14 DIAGNOSIS — Z853 Personal history of malignant neoplasm of breast: Secondary | ICD-10-CM

## 2016-07-14 DIAGNOSIS — I10 Essential (primary) hypertension: Secondary | ICD-10-CM

## 2016-07-14 DIAGNOSIS — C50412 Malignant neoplasm of upper-outer quadrant of left female breast: Secondary | ICD-10-CM

## 2016-07-14 DIAGNOSIS — Z170 Estrogen receptor positive status [ER+]: Principal | ICD-10-CM

## 2016-07-14 LAB — CBC WITH DIFFERENTIAL/PLATELET
BASO%: 0.4 % (ref 0.0–2.0)
BASOS ABS: 0 10*3/uL (ref 0.0–0.1)
EOS%: 3.9 % (ref 0.0–7.0)
Eosinophils Absolute: 0.3 10*3/uL (ref 0.0–0.5)
HEMATOCRIT: 41.5 % (ref 34.8–46.6)
HGB: 13.9 g/dL (ref 11.6–15.9)
LYMPH#: 2.6 10*3/uL (ref 0.9–3.3)
LYMPH%: 33.7 % (ref 14.0–49.7)
MCH: 29 pg (ref 25.1–34.0)
MCHC: 33.6 g/dL (ref 31.5–36.0)
MCV: 86.3 fL (ref 79.5–101.0)
MONO#: 0.4 10*3/uL (ref 0.1–0.9)
MONO%: 5.6 % (ref 0.0–14.0)
NEUT#: 4.3 10*3/uL (ref 1.5–6.5)
NEUT%: 56.4 % (ref 38.4–76.8)
PLATELETS: 215 10*3/uL (ref 145–400)
RBC: 4.8 10*6/uL (ref 3.70–5.45)
RDW: 12.9 % (ref 11.2–14.5)
WBC: 7.7 10*3/uL (ref 3.9–10.3)

## 2016-07-14 LAB — COMPREHENSIVE METABOLIC PANEL
ALT: 29 U/L (ref 0–55)
ANION GAP: 9 meq/L (ref 3–11)
AST: 28 U/L (ref 5–34)
Albumin: 3.6 g/dL (ref 3.5–5.0)
Alkaline Phosphatase: 73 U/L (ref 40–150)
BUN: 14.6 mg/dL (ref 7.0–26.0)
CALCIUM: 9.7 mg/dL (ref 8.4–10.4)
CHLORIDE: 103 meq/L (ref 98–109)
CO2: 28 mEq/L (ref 22–29)
CREATININE: 0.7 mg/dL (ref 0.6–1.1)
EGFR: 82 mL/min/{1.73_m2} — AB (ref >90)
Glucose: 115 mg/dl (ref 70–140)
POTASSIUM: 3.7 meq/L (ref 3.5–5.1)
Sodium: 140 mEq/L (ref 136–145)
Total Bilirubin: 0.29 mg/dL (ref 0.20–1.20)
Total Protein: 7.3 g/dL (ref 6.4–8.3)

## 2016-07-14 NOTE — Telephone Encounter (Signed)
NO LOS 6/19

## 2016-07-14 NOTE — Progress Notes (Signed)
ID: Barbara Washington   DOB: [birth year]  MR#: [id number]  UGQ#:[id number]  PCP: Barbara Washington GYN:  SUR:  Gwenn Pavlovitz. MD (in [place]) [place]:  Bronwen Betters, MD (in [place]) OTHER:    CHIEF COMPLAINT:  Hx of Left noninvasive breast cancer  CURRENT TREATMENT: TAMOXIFEN    HISTORY OF PRESENT ILLNESS: From the original intake note:  The patient had routine screening mammography at [facility] 03/16/2011, suggesting new microcalcifications in the left breast. She was recalled for additional views 03/18/2011 and this confirmed a cluster of pleomorphic microcalcifications in the upper outer quadrant of the left breast, measuring approximately 1.2 cm. 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  Because the patient's daughter, who is a physician, lives in [place], the patient went there for definitive treatment, and after appropriate discussion underwent a left lumpectomy 03/31/2011 under Dr.Gwenn Pavlovitz. The pathology from this procedure [facility] [id number]) showed an intermediate grade ductal carcinoma in situ measuring 9 mm, with negative margins (closest 4 mm), with Allred scores of 8 for both estrogen and progesterone receptor positivity. Postoperatively the patient received radiation to the left breast under Dr. Gwyndolyn Saxon Pao (50 gray to the breast and 10 gray he a boost to the scar) completed 06/19/2011.  The patient met with Dr.Corey Shamah to discuss systemic adjuvant therapy, and Dr. Angelena Sole recommended tamoxifen. The patient however has not get started that medication. She has returned to the [place] area and established herself in our practice 07/17/2011.   Her subsequent history is as detailed below   INTERVAL HISTORY: Barbara Washington returns today for follow-up and treatment of her estrogen receptor positive breast cancer. She continues on tamoxifen, with excellent  tolerance. She has had no pros with hot flashes or vaginal wetness. She obtains it at approximately $11 Washington month.   returns today for follow-up of her ductal carcinoma in situ. From a breast cancer point of view she is doing fine. She just had a mammogram in February which was unremarkable. She continues on tamoxifen with no side effects from that medication that she is aware of.  REVIEW OF SYSTEMS: She is doing some traveling to visit family. A detailed review of systems today was otherwise stable  PAST MEDICAL HISTORY: Past Medical History:  Diagnosis Date  . Cataract   . DCIS (ductal carcinoma in situ) of breast   . Diverticular disease   . Hyperlipidemia   . Hypertension   . Mitral valve prolapse     PAST SURGICAL HISTORY: Past Surgical History:  Procedure Laterality Date  . ABDOMINAL HYSTERECTOMY  2008   [place]DOM[place]AL [place]ST [place]  . BRE[place] SURGERY     [place]  . CATARACT EXTRACTION, BILATERAL    . EYE SURGERY    . TOTAL ABDOMINAL HYSTERECTOMY W/ BILATERAL SALPINGOOPHORECTOMY      FAMILY HISTORY The patient's father died at the age of 21. He had high blood pressure. The patient's mother died at the age of 79, from causes unknown to the patient. The patient has one brother and 4 sisters. There is no breast or ovarian cancer or indeed any other cancer in the family to her knowledge.  GYNECOLOGIC HISTORY: (Reviewed 07/13/2013) She had menarche age 89. She underwent hysterectomy January of 1998. She took hormone placement for 1 year. 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  Remotely she did some sewing work. Her husband [name] is a Pensions consultant. They are originally from [place], [place], and have lived in [place] since 2007. Their daughter in [place], [place], is a physician, although currently not practicing. [Phone/fax = [phone]] She has 2  children. Their son [name] in [place] works in Engineer, technical sales. He has one child. The patient is a Nurse, learning disability   ADVANCED DIRECTIVES: not in place  HEALTH MAINTENANCE: Social History  Substance Use Topics  . Smoking status: Never Smoker  . Smokeless tobacco: Never Used  . Alcohol use 0.0 - 0.5 oz/week     Colonoscopy: 2009  PAP: 2013  Bone density: 2013  Lipid panel: [place] Dr. Karle Starch  Allergies  Allergen Reactions  . Ibuprofen Nausea And Vomiting  . Tape     Current Outpatient Prescriptions  Medication Sig Dispense Refill  . miconazole nitrate (EQL MICONAZOLE 3) 200-2 MG-% (9GM) KIT aplique al area irritada diariamente mientras necesario (Patient not taking: Reported on 11/05/2015) 1 each 6  . naproxen sodium (ANAPROX) 220 MG tablet Take 220 mg by mouth 2 (two) times daily with a meal.    . omeprazole (PRILOSEC) 40 MG capsule Take 1 capsule (40 mg total) by mouth daily. 90 capsule 4  . rosuvastatin (CRESTOR) 10 MG tablet Take 1 tablet (10 mg total) by mouth daily. 90 tablet 3  . tamoxifen (NOLVADEX) 20 MG tablet TAKE ONE TABLET BY MOUTH EVERY DAY 90 tablet 3  . tamoxifen (NOLVADEX) 20 MG tablet TAKE ONE TABLET BY MOUTH ONCE DAILY 87 tablet 4  . triamterene-hydrochlorothiazide (DYAZIDE) 37.5-25 MG capsule Take 1 each (1 capsule total) by mouth daily. 30 capsule 5   No current facility-administered medications for this visit.     OBJECTIVE: Middle-aged Latin woman In no acute distress Vitals:   07/14/16 0907  BP: (!) 140/54  Pulse: 74  Resp: 18  Temp: 98.2 F (36.8 C)     Body mass index is 28.64 kg/m.    ECOG FS: 0 Filed Weights   07/14/16 0907  Weight: 139 lb 6.4 oz (63.2 kg)   Sclerae unicteric, pupils round and equal Oropharynx clear and moist No cervical or supraclavicular adenopathy Lungs no rales or rhonchi Heart regular rate and rhythm Abd soft, nontender, positive bowel sounds MSK no focal spinal tenderness, no upper extremity lymphedema Neuro: nonfocal, well oriented,  appropriate affect Breasts: The right breast is benign. The left breast is undergone lumpectomy and radiation with no evidence of local recurrence. Both axillae are benign.    LAB RESULTS: Lab Results  Component Value Date   WBC 7.7 07/14/2016   NEUTROABS 4.3 07/14/2016   HGB 13.9 07/14/2016   HCT 41.5 07/14/2016   MCV 86.3 07/14/2016   PLT 215 07/14/2016      Chemistry      Component Value Date/Time   NA 143 05/04/2016 0919   NA 141 08/01/2015 0840   K 3.9 05/04/2016 0919   K 4.0 08/01/2015 0840   CL 104 05/04/2016 0919   CL 109 (H) 04/04/2012 1003   CO2 23 05/04/2016 0919   CO2 24 08/01/2015 0840   BUN 14 05/04/2016 0919   BUN 12.8 08/01/2015 0840   CREATININE 0.62 05/04/2016 0919   CREATININE 0.7 08/01/2015 0840      Component Value Date/Time   CALCIUM 9.0 05/04/2016 0919   CALCIUM 9.2 08/01/2015 0840   ALKPHOS 73 05/04/2016 0919   ALKPHOS 69 08/01/2015 0840   AST 19 05/04/2016 0919   AST  24 08/01/2015 0840   ALT 18 05/04/2016 0919   ALT 27 08/01/2015 0840   BILITOT <0.2 05/04/2016 0919   BILITOT <0.30 08/01/2015 0840       STUDIES: Mammography February of this year was benign [place] patient report. I do not have the actual record today but it has been requested  ASSESSMENT: 71 y.o. Spanish speaker originally from [place], [place],   (1)  status post left lumpectomy 03/31/2011 for a grade 2 ductal carcinoma in situ, [stage 0], strongly estrogen and progesterone receptor positive, with negative margins;   (2)  status post radiation completed May 2013.  (3)  on tamoxifen since 07/17/2011, completing 5 years June 2018  PLAN:  Tanveer is now a little bit over 5 years out from definitive surgery for her breast cancer with no evidence of disease recurrence. This is very favorable.  She has completed 5 years of tamoxifen. For some patients, who have invasive disease or high risk disease, we continue tamoxifen for a total of 10 years. We do not have data for  more than 5 years of tamoxifen in noninvasive disease.  Accordingly at this point I'm comfortable releasing her to her primary care physician. She will need in terms of breast cancer follow-up is yearly mammography and a yearly physician breast exam.  I will be glad to see Barbara Washington at any point in the future if on when the need arises but as of now are making a further appointment for her here. Chauncey Cruel, MD     07/14/2016

## 2017-05-03 ENCOUNTER — Ambulatory Visit: Payer: Self-pay | Admitting: *Deleted

## 2017-06-16 ENCOUNTER — Other Ambulatory Visit: Payer: Self-pay | Admitting: Emergency Medicine

## 2017-06-17 ENCOUNTER — Other Ambulatory Visit: Payer: Self-pay | Admitting: Emergency Medicine

## 2017-07-08 ENCOUNTER — Emergency Department (HOSPITAL_COMMUNITY)
Admission: EM | Admit: 2017-07-08 | Discharge: 2017-07-08 | Disposition: A | Discharge status: Home / Self Care | Payer: Medicare Other | Attending: Emergency Medicine | Admitting: Emergency Medicine

## 2017-07-08 ENCOUNTER — Emergency Department (HOSPITAL_COMMUNITY): Payer: Medicare Other

## 2017-07-08 DIAGNOSIS — I10 Essential (primary) hypertension: Secondary | ICD-10-CM | POA: Insufficient documentation

## 2017-07-08 DIAGNOSIS — Z79899 Other long term (current) drug therapy: Secondary | ICD-10-CM | POA: Insufficient documentation

## 2017-07-08 DIAGNOSIS — E785 Hyperlipidemia, unspecified: Secondary | ICD-10-CM | POA: Diagnosis not present

## 2017-07-08 DIAGNOSIS — Z7982 Long term (current) use of aspirin: Secondary | ICD-10-CM | POA: Diagnosis not present

## 2017-07-08 DIAGNOSIS — R112 Nausea with vomiting, unspecified: Secondary | ICD-10-CM | POA: Diagnosis present

## 2017-07-08 DIAGNOSIS — R7989 Other specified abnormal findings of blood chemistry: Secondary | ICD-10-CM

## 2017-07-08 DIAGNOSIS — R945 Abnormal results of liver function studies: Secondary | ICD-10-CM | POA: Insufficient documentation

## 2017-07-08 DIAGNOSIS — Z853 Personal history of malignant neoplasm of breast: Secondary | ICD-10-CM | POA: Diagnosis not present

## 2017-07-08 LAB — URINALYSIS, ROUTINE W REFLEX MICROSCOPIC
Bilirubin Urine: NEGATIVE
Glucose, UA: NEGATIVE mg/dL
Ketones, ur: NEGATIVE mg/dL
Leukocytes, UA: NEGATIVE
Nitrite: NEGATIVE
PH: 5 (ref 5.0–8.0)
Protein, ur: NEGATIVE mg/dL
SPECIFIC GRAVITY, URINE: 1.033 — AB (ref 1.005–1.030)

## 2017-07-08 LAB — COMPREHENSIVE METABOLIC PANEL
ALBUMIN: 3.9 g/dL (ref 3.5–5.0)
ALK PHOS: 128 U/L — AB (ref 38–126)
ALT: 90 U/L — AB (ref 14–54)
AST: 162 U/L — ABNORMAL HIGH (ref 15–41)
Anion gap: 15 (ref 5–15)
BILIRUBIN TOTAL: 1.4 mg/dL — AB (ref 0.3–1.2)
BUN: 17 mg/dL (ref 6–20)
CALCIUM: 9 mg/dL (ref 8.9–10.3)
CO2: 22 mmol/L (ref 22–32)
CREATININE: 0.86 mg/dL (ref 0.44–1.00)
Chloride: 104 mmol/L (ref 101–111)
GFR calc Af Amer: >60 mL/min (ref >60)
GFR calc non Af Amer: >60 mL/min (ref >60)
Glucose, Bld: 144 mg/dL — ABNORMAL HIGH (ref 65–99)
Potassium: 3.3 mmol/L — ABNORMAL LOW (ref 3.5–5.1)
SODIUM: 141 mmol/L (ref 135–145)
TOTAL PROTEIN: 7 g/dL (ref 6.5–8.1)

## 2017-07-08 LAB — LIPASE, BLOOD: Lipase: 40 U/L (ref 11–51)

## 2017-07-08 LAB — CBC
HCT: 46.2 % — ABNORMAL HIGH (ref 36.0–46.0)
Hemoglobin: 15.4 g/dL — ABNORMAL HIGH (ref 12.0–15.0)
MCH: 29.3 pg (ref 26.0–34.0)
MCHC: 33.3 g/dL (ref 30.0–36.0)
MCV: 87.8 fL (ref 78.0–100.0)
Platelets: 143 10*3/uL — ABNORMAL LOW (ref 150–400)
RBC: 5.26 MIL/uL — ABNORMAL HIGH (ref 3.87–5.11)
RDW: 13.7 % (ref 11.5–15.5)
WBC: 6.3 10*3/uL (ref 4.0–10.5)

## 2017-07-08 MED ORDER — SODIUM CHLORIDE 0.9 % IV BOLUS
1000.0000 mL | Freq: Once | INTRAVENOUS | Status: AC
  Administered 2017-07-08: 1000 mL via INTRAVENOUS

## 2017-07-08 MED ORDER — IOPAMIDOL (ISOVUE-300) INJECTION 61%
INTRAVENOUS | Status: AC
  Filled 2017-07-08: qty 100

## 2017-07-08 MED ORDER — ONDANSETRON HCL 4 MG/2ML IJ SOLN
4.0000 mg | Freq: Once | INTRAMUSCULAR | Status: AC
  Administered 2017-07-08: 4 mg via INTRAVENOUS
  Filled 2017-07-08: qty 2

## 2017-07-08 MED ORDER — IOPAMIDOL (ISOVUE-300) INJECTION 61%
100.0000 mL | Freq: Once | INTRAVENOUS | Status: AC | PRN
  Administered 2017-07-08: 100 mL via INTRAVENOUS

## 2017-07-08 MED ORDER — ONDANSETRON HCL 4 MG PO TABS: 4.0000 mg | ORAL_TABLET | Freq: Four times a day (QID) | ORAL | 0 refills | Status: DC

## 2017-07-08 NOTE — ED Notes (Signed)
ED Provider at bedside. 

## 2017-07-08 NOTE — ED Notes (Signed)
Patient transported to CT 

## 2017-07-08 NOTE — Discharge Instructions (Signed)
Your liver tests were elevated.  This will need follow-up by your primary care doctor.  Prescription for nausea medication.  Return if worse.

## 2017-07-08 NOTE — ED Notes (Signed)
Ultrasound at bedside

## 2017-07-08 NOTE — ED Triage Notes (Signed)
Transported by [facility] from home--sudden onset of n/v which started at 4 am. Denies any abdominal pain. Pt reports right flank pain that radiates to right lower back. EMS administered 4 mg of Zofran. VSS.

## 2017-07-08 NOTE — ED Notes (Signed)
Bed: WA25 Expected date:  Expected time:  Means of arrival:  Comments: EMS-N/V 

## 2017-07-09 NOTE — ED Provider Notes (Signed)
[facility] DEPT Provider Note   CSN: [id number] Arrival date & time: 07/08/17  0800     History   Chief Complaint Chief Complaint  Patient presents with  . Emesis    HPI Barbara Washington is a 72 y.o. female.  Nausea and vomiting since 4 AM today without abdominal pain.  Review of systems positive for right flank pain that radiates to the right lower back.  She had been well prior to event.  No fever, sweats, chills, hematuria, dysuria, chest pain, dyspnea.  Severity of symptoms is mild to moderate.  Nothing makes symptoms better or worse.     Past Medical History:  Diagnosis Date  . Cataract   . DCIS (ductal carcinoma in situ) of breast   . Diverticular disease   . Hyperlipidemia   . Hypertension   . Mitral valve prolapse     Patient Active Problem List   Diagnosis Date Noted  . Breast cancer of upper-outer quadrant of left female breast ([place]) 10/17/2012    Past Surgical History:  Procedure Laterality Date  . ABDOMINAL HYSTERECTOMY  2008   [place]DOM[place]AL [place]ST [place]  . BREAST SURGERY     [place]  . CATARACT EXTRACTION, BILATERAL    . EYE SURGERY    . TOTAL ABDOMINAL HYSTERECTOMY W/ BILATERAL SALPINGOOPHORECTOMY       OB History    Gravida  2   Para  2   Term      Preterm      AB      Living  2     SAB      TAB      Ectopic      Multiple      Live Births               Home Medications    Prior to Admission medications   Medication Sig Start Date End Date Taking? Authorizing Provider  aspirin EC 81 MG tablet Take 81 mg by mouth daily.   Yes [provider]  Calcium Carbonate-Vitamin D 600-400 MG-UNIT tablet Take 1 tablet by mouth 2 (two) times daily.   Yes [provider]  metoprolol succinate (TOPROL-XL) 25 MG 24 hr tablet Take 25 mg by mouth daily. 03/01/17  Yes [provider]  naproxen sodium (ANAPROX) 220 MG tablet Take 220 mg by mouth daily as needed (pain).    Yes  [provider]  omeprazole (PRILOSEC) 40 MG capsule Take 1 capsule (40 mg total) by mouth daily. 08/01/15  Yes Magrinat, Virgie Dad, MD  rosuvastatin (CRESTOR) 10 MG tablet Take 1 tablet (10 mg total) by mouth daily. 05/04/16  Yes Sagardia, Ines Bloomer, MD  triamterene-hydrochlorothiazide (DYAZIDE) 37.5-25 MG capsule Take 1 each (1 capsule total) by mouth daily. 05/04/16 07/08/17 Yes Sagardia, Ines Bloomer, MD  ondansetron (ZOFRAN) 4 MG tablet Take 1 tablet (4 mg total) by mouth every 6 (six) hours. 07/08/17   Nat Christen, MD    Family History Family History  Problem Relation Age of Onset  . Hypertension Father   . Hypertension Sister   . Diabetes Brother     Social History Social History   Tobacco Use  . Smoking status: Never Smoker  . Smokeless tobacco: Never Used  Substance Use Topics  . Alcohol use: Yes    Alcohol/week: 0.0 - 0.6 oz  . Drug use: No     Allergies   Losartan; Omeprazole; and Tape   Review of Systems Review of  Systems  All other systems reviewed and are negative.    Physical Exam Updated Vital Signs BP (!) 103/58   Pulse 88   Temp 97.6 F (36.4 C) (Oral)   Resp 18   SpO2 92%   Physical Exam  Constitutional: She is oriented to person, place, and time. She appears well-developed and well-nourished.  HENT:  Head: Normocephalic and atraumatic.  Eyes: Conjunctivae are normal.  Neck: Neck supple.  Cardiovascular: Normal rate and regular rhythm.  Pulmonary/Chest: Effort normal and breath sounds normal.  Abdominal: Soft. Bowel sounds are normal.  Nontender abdomen.  Musculoskeletal: Normal range of motion.  Neurological: She is alert and oriented to person, place, and time.  Skin: Skin is warm and dry.  Psychiatric: She has a normal mood and affect. Her behavior is normal.  Nursing note and vitals reviewed.    ED Treatments / Results  Labs (all labs ordered are listed, but only abnormal results are displayed) Labs Reviewed  COMPREHENSIVE  METABOLIC PANEL - Abnormal; Notable for the following components:      Result Value   Potassium 3.3 (*)    Glucose, Bld 144 (*)    AST 162 (*)    ALT 90 (*)    Alkaline Phosphatase 128 (*)    Total Bilirubin 1.4 (*)    All other components within normal limits  CBC - Abnormal; Notable for the following components:   RBC 5.26 (*)    Hemoglobin 15.4 (*)    HCT 46.2 (*)    Platelets 143 (*)    All other components within normal limits  URINALYSIS, ROUTINE W REFLEX MICROSCOPIC - Abnormal; Notable for the following components:   Specific Gravity, Urine 1.033 (*)    Hgb urine dipstick SMALL (*)    Bacteria, UA RARE (*)    All other components within normal limits  LIPASE, BLOOD    EKG None  Radiology Ct Abdomen Pelvis W Contrast  Result Date: 07/08/2017 CLINICAL DATA:  Pain with nausea and vomiting EXAM: CT ABDOMEN AND PELVIS WITH CONTRAST TECHNIQUE: Multidetector CT imaging of the abdomen and pelvis was performed using the standard protocol following bolus administration of intravenous contrast. CONTRAST:  1109mL ISOVUE-300 IOPAMIDOL (ISOVUE-300) INJECTION 61% COMPARISON:  November 16, 2006 FINDINGS: Lower chest: There is bibasilar atelectatic change. 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 Liver extends into this hernia. There is diffuse hepatic steatosis with mild fatty sparing adjacent to the gallbladder fossa. No focal liver lesions are evident. Gallbladder wall is not appreciably thickened. There is no biliary duct dilatation. Pancreas: There is no pancreatic mass or inflammatory focus. Spleen: No splenic lesions are evident. Adrenals/Urinary Tract: Adrenals bilaterally appear normal. There is no appreciable renal mass or hydronephrosis on either side. There is no evident renal or ureteral calculus on either side. Urinary bladder is midline with wall thickness within normal limits. Stomach/Bowel: There is extensive diverticulosis in the distal  descending colon and sigmoid colon regions. There are foci of wall thickening in the mid the distal sigmoid colon, likely due to muscular hypertrophy from chronic diverticulosis. Pilar Plate diverticulitis is not demonstrable on this study. There are scattered right-sided colonic diverticula without inflammatory change evident. Beyond changes in the sigmoid colon, there is no bowel wall thickening. No mesenteric thickening evident. No bowel obstruction. No free air or portal venous air. Vascular/Lymphatic: There is atherosclerotic calcification throughout the aorta and common iliac arteries. No aneurysm evident. There is moderate calcification in the proximal major mesenteric arterial  vessels. There is no major mesenteric arterial vessel obstruction. There is a circumaortic left renal vein, an anatomic variant. There is no appreciable adenopathy in the abdomen or pelvis. Reproductive: Uterus is absent.  No evident pelvic mass. Other: Appendix appears normal. There is no abscess or ascites evident in the abdomen or pelvis. Musculoskeletal: There is a benign hemangioma occupying much of the L1 vertebral body. There is degenerative change in the lumbar spine. There are no blastic or lytic bone lesions. No intramuscular or abdominal wall lesion evident. IMPRESSION: 1. Multiple sites of diverticulosis. There is extensive sigmoid diverticulosis. Wall thickening is noted in portions of the sigmoid colon, likely due to muscular hypertrophy from chronic diverticulosis. No overt diverticulitis is seen. The earliest changes of diverticulitis potentially could present in this manner, however. 2. No evident bowel obstruction. No abscess in the abdomen or pelvis. Appendix appears normal. 3.  No renal or ureteral calculus.  No hydronephrosis. 4. Hepatic steatosis with mild sparing near the gallbladder fossa region. 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 5.   Aortoiliac atherosclerosis.  No aneurysm. 6.  Benign hemangioma in the L1 vertebral body. Aortic Atherosclerosis (ICD10-I70.0). Electronically Signed   By: Lowella Grip III M.D.   On: 07/08/2017 11:21   US Abdomen Limited  Result Date: 07/08/2017 CLINICAL DATA:  Elevated liver function tests EXAM: ULTRASOUND ABDOMEN LIMITED RIGHT UPPER QUADRANT COMPARISON:  CT abdomen pelvis of 07/08/2017 FINDINGS: Gallbladder: History of prior cholecystectomy was given, but on this study and on the recent CT, the gallbladder is visualized and no gallstones are noted. The gallbladder wall is not thickened and no pain is present over the gallbladder with compression. Common bile duct: Diameter: The common bile duct is normal measuring 1.9 mm. No filling defect is seen. Liver: The parenchyma of the liver is echogenic and inhomogeneous consistent with hepatic steatosis. No focal hepatic abnormality is seen. Portal vein flow is patent and in the normal direction. \ IMPRESSION: 1. Although history of prior cholecystectomy was given, the gallbladder is visualized with no gallstones. 2. No common bile duct dilatation or filling defect is seen. 3. Hepatic steatosis. Electronically Signed   By: Ivar Drape M.D.   On: 07/08/2017 14:22    Procedures Procedures (including critical care time)  Medications Ordered in ED Medications  ondansetron (ZOFRAN) injection 4 mg (4 mg Intravenous Given 07/08/17 0851)  sodium chloride 0.9 % bolus 1,000 mL (0 mLs Intravenous Stopped 07/08/17 1021)  iopamidol (ISOVUE-300) 61 % injection 100 mL (100 mLs Intravenous Contrast Given 07/08/17 1049)     Initial Impression / Assessment and Plan / ED Course  I have reviewed the triage vital signs and the nursing notes.  Pertinent labs & imaging results that were available during my care of the patient were reviewed by me and considered in my medical decision making (see chart for details).     Patient presents with nausea and vomiting since  4 AM.  No acute abdomen on physical exam.  White count is normal.  There is a mild pain elevation of her liver functions.  Ultrasound shows no cholelithiasis or cholecystitis.  CT reveals diverticulosis but no diverticulitis.  Patient remained hemodynamically stable.  Findings of test were discussed with the patient and her husband.  She will follow-up with her primary care doctor.  Final Clinical Impressions(s) / ED Diagnoses   Final diagnoses:  Intractable vomiting with nausea, unspecified vomiting type  Elevated liver function  tests    ED Discharge Orders        Ordered    ondansetron (ZOFRAN) 4 MG tablet  Every 6 hours     07/08/17 1534       Nat Christen, MD 07/09/17 1539

## 2017-09-20 ENCOUNTER — Ambulatory Visit: Payer: Self-pay | Admitting: *Deleted

## 2018-10-02 ENCOUNTER — Emergency Department (HOSPITAL_COMMUNITY): Payer: Medicare Other

## 2018-10-02 ENCOUNTER — Emergency Department (HOSPITAL_COMMUNITY)
Admission: EM | Admit: 2018-10-02 | Discharge: 2018-10-02 | Disposition: A | Discharge status: Home / Self Care | Payer: Medicare Other | Attending: Emergency Medicine | Admitting: Emergency Medicine

## 2018-10-02 ENCOUNTER — Encounter (HOSPITAL_COMMUNITY): Payer: Self-pay

## 2018-10-02 ENCOUNTER — Other Ambulatory Visit: Payer: Self-pay

## 2018-10-02 DIAGNOSIS — I10 Essential (primary) hypertension: Secondary | ICD-10-CM | POA: Insufficient documentation

## 2018-10-02 DIAGNOSIS — Z79899 Other long term (current) drug therapy: Secondary | ICD-10-CM | POA: Insufficient documentation

## 2018-10-02 DIAGNOSIS — R0789 Other chest pain: Secondary | ICD-10-CM | POA: Insufficient documentation

## 2018-10-02 DIAGNOSIS — R079 Chest pain, unspecified: Secondary | ICD-10-CM

## 2018-10-02 LAB — BASIC METABOLIC PANEL
Anion gap: 4 — ABNORMAL LOW (ref 5–15)
BUN: 14 mg/dL (ref 8–23)
CO2: 26 mmol/L (ref 22–32)
Calcium: 8.5 mg/dL — ABNORMAL LOW (ref 8.9–10.3)
Chloride: 105 mmol/L (ref 98–111)
Creatinine, Ser: 0.62 mg/dL (ref 0.44–1.00)
GFR calc Af Amer: >60 mL/min (ref >60)
GFR calc non Af Amer: >60 mL/min (ref >60)
Glucose, Bld: 109 mg/dL — ABNORMAL HIGH (ref 70–99)
Potassium: 3.8 mmol/L (ref 3.5–5.1)
Sodium: 135 mmol/L (ref 135–145)

## 2018-10-02 LAB — CBC
HCT: 43.3 % (ref 36.0–46.0)
Hemoglobin: 13.9 g/dL (ref 12.0–15.0)
MCH: 28.1 pg (ref 26.0–34.0)
MCHC: 32.1 g/dL (ref 30.0–36.0)
MCV: 87.5 fL (ref 80.0–100.0)
Platelets: 250 10*3/uL (ref 150–400)
RBC: 4.95 MIL/uL (ref 3.87–5.11)
RDW: 13.1 % (ref 11.5–15.5)
WBC: 6.2 10*3/uL (ref 4.0–10.5)
nRBC: 0 % (ref 0.0–0.2)

## 2018-10-02 LAB — TROPONIN I (HIGH SENSITIVITY)
Troponin I (High Sensitivity): 4 ng/L (ref <18)
Troponin I (High Sensitivity): 13 ng/L (ref <18)

## 2018-10-02 MED ORDER — SODIUM CHLORIDE 0.9% FLUSH: 3.0000 mL | Freq: Once | INTRAVENOUS | Status: DC

## 2018-10-02 NOTE — ED Notes (Signed)
Patient transported to XR. 

## 2018-10-02 NOTE — ED Triage Notes (Signed)
Pt states left sided chest pain since yesterday. Pt states minor coughing.

## 2018-10-02 NOTE — Discharge Instructions (Addendum)
Testing today did not show any problems with your heart or lungs.  We are not sure exactly why you had chest discomfort.  It is safe to use Tylenol for the chest pain.  Follow-up with your doctor for checkup in about a week.  If your pain worsens, changes or you have other concerns return here.  Your doctor may be able to help you or he may refer you to a cardiologist for further testing such as a cardiac stress test.

## 2018-10-02 NOTE — ED Notes (Signed)
Patient transported to X-ray 

## 2018-10-02 NOTE — ED Notes (Signed)
Pt ambulated to/from the restroom with standby assist.

## 2018-10-02 NOTE — ED Provider Notes (Signed)
[facility] [facility] DEPT Provider Note   CSN: [id number] Arrival date & time: 10/02/18  [id number]     History   Chief Complaint Chief Complaint  Patient presents with  . Chest Pain    HPI Barbara Washington is a 73 y.o. female.     HPI   She is here for evaluation of chest pain associated with coughing, which is nonproductive.  Chest pain is been on and off since yesterday, and is felt as a mild soreness anteriorly.  She also has sensation of feeling hot and sweating intermittently.  She states she has left ear pain but no loss of hearing.  She has not had nasal discharge, sore throat or neck pain.  She complains of cough, nonproductive, occasionally for several days.  She denies known sick contacts for illnesses including Covid-19.  No prior cardiac history.  She has history of GERD, and takes a PPI.  Has history of breast cancer.  There are no other known modifying factors.  Past Medical History:  Diagnosis Date  . Cataract   . DCIS (ductal carcinoma in situ) of breast   . Diverticular disease   . Hyperlipidemia   . Hypertension   . Mitral valve prolapse     Patient Active Problem List   Diagnosis Date Noted  . Breast cancer of upper-outer quadrant of left female breast ([place]) 10/17/2012    Past Surgical History:  Procedure Laterality Date  . ABDOMINAL HYSTERECTOMY  2008   ABDOMINAL HYST - [place]  . BREAST SURGERY     [place]  . CATARACT EXTRACTION, BILATERAL    . EYE SURGERY    . TOTAL ABDOMINAL HYSTERECTOMY W/ BILATERAL SALPINGOOPHORECTOMY       OB History    Gravida  2   Para  2   Term      Preterm      AB      Living  2     SAB      TAB      Ectopic      Multiple      Live Births               Home Medications    Prior to Admission medications   Medication Sig Start Date End Date Taking? Authorizing Provider  aspirin EC 81 MG tablet Take 81 mg by mouth daily.   Yes [provider]  metoprolol succinate  (TOPROL-XL) 25 MG 24 hr tablet Take 25 mg by mouth daily. 03/01/17  Yes [provider]  omeprazole (PRILOSEC) 40 MG capsule Take 1 capsule (40 mg total) by mouth daily. 08/01/15  Yes Magrinat, Virgie Dad, MD  ondansetron (ZOFRAN) 4 MG tablet Take 1 tablet (4 mg total) by mouth every 6 (six) hours. 07/08/17  Yes Nat Christen, MD  rosuvastatin (CRESTOR) 10 MG tablet Take 1 tablet (10 mg total) by mouth daily. 05/04/16  Yes SagardiaInes Bloomer, MD    Family History Family History  Problem Relation Age of Onset  . Hypertension Father   . Hypertension Sister   . Diabetes Brother     Social History Social History   Tobacco Use  . Smoking status: Never Smoker  . Smokeless tobacco: Never Used  Substance Use Topics  . Alcohol use: Yes    Alcohol/week: 0.0 - 1.0 standard drinks  . Drug use: No     Allergies   Losartan, Ibuprofen, and Tape   Review of Systems Review of Systems  All other  systems reviewed and are negative.    Physical Exam Updated Vital Signs BP 130/79   Pulse 63   Temp 97.9 F (36.6 C) (Oral)   Resp 18   Wt 63 kg   SpO2 96%   BMI 28.53 kg/m   Physical Exam Vitals signs and nursing note reviewed.  Constitutional:      General: She is not in acute distress.    Appearance: She is well-developed. She is not ill-appearing, toxic-appearing or diaphoretic.  HENT:     Head: Normocephalic and atraumatic.     Right Ear: Tympanic membrane normal.     Left Ear: Tympanic membrane normal.     Nose: No congestion or rhinorrhea.     Mouth/Throat:     Mouth: Mucous membranes are moist.     Pharynx: No oropharyngeal exudate or posterior oropharyngeal erythema.  Eyes:     Conjunctiva/sclera: Conjunctivae normal.     Pupils: Pupils are equal, round, and reactive to light.  Neck:     Musculoskeletal: Normal range of motion and neck supple.     Trachea: Phonation normal.  Cardiovascular:     Rate and Rhythm: Normal rate and regular rhythm.  Pulmonary:      Effort: Pulmonary effort is normal. No respiratory distress.     Breath sounds: Normal breath sounds. No stridor. No wheezing or rhonchi.  Chest:     Chest wall: Tenderness (Mild mid lower anterior chest wall tenderness without crepitation or deformity.) present.  Abdominal:     General: There is no distension.     Palpations: Abdomen is soft.     Tenderness: There is no abdominal tenderness. There is no guarding.  Musculoskeletal: Normal range of motion.  Skin:    General: Skin is warm and dry.  Neurological:     Mental Status: She is alert and oriented to person, place, and time.     Motor: No abnormal muscle tone.  Psychiatric:        Mood and Affect: Mood normal.        Behavior: Behavior normal.        Thought Content: Thought content normal.        Judgment: Judgment normal.      ED Treatments / Results  Labs (all labs ordered are listed, but only abnormal results are displayed) Labs Reviewed  BASIC METABOLIC PANEL - Abnormal; Notable for the following components:      Result Value   Glucose, Bld 109 (*)    Calcium 8.5 (*)    Anion gap 4 (*)    All other components within normal limits  CBC  TROPONIN I (HIGH SENSITIVITY)  TROPONIN I (HIGH SENSITIVITY)    EKG EKG Interpretation  Date/Time:  Sunday October 02 2018 08:46:16 EDT Ventricular Rate:  59 PR Interval:    QRS Duration: 93 QT Interval:  415 QTC Calculation: 412 R Axis:   75 Text Interpretation:  Sinus rhythm Atrial premature complex Low voltage, precordial leads No old tracing to compare Confirmed by Daleen Bo [phone]) on 10/02/2018 8:51:20 AM   Radiology Dg Chest 2 View  Result Date: 10/02/2018 CLINICAL DATA:  Left chest pain and cough for 1 day. EXAM: CHEST - 2 VIEW COMPARISON:  PA and lateral chest 07/26/2018 and 08/01/2015. FINDINGS: The lungs are clear. Heart size is normal. No pneumothorax or pleural fluid. No acute or focal bony abnormality. IMPRESSION: Negative chest. Electronically Signed    By: Inge Rise M.D.   On: 10/02/2018 09:40  Procedures Procedures (including critical care time)  Medications Ordered in ED Medications - No data to display   Initial Impression / Assessment and Plan / ED Course  I have reviewed the triage vital signs and the nursing notes.  Pertinent labs & imaging results that were available during my care of the patient were reviewed by me and considered in my medical decision making (see chart for details).  Clinical Course as of Oct 02 1450  Nancy Fetter Oct 02, 2018  1308 Normal  Troponin I (High Sensitivity) [EW]  1308 Normal except glucose high, calcium low, amitriptyline  Basic metabolic panel(!) [EW]  123XX123 Normal  CBC [EW]  1308 No infiltrate or CHF, images reviewed by me  DG Chest 2 View [EW]    Clinical Course User Index [EW] Daleen Bo, MD        Patient Vitals for the past 24 hrs:  BP Temp Temp src Pulse Resp SpO2 Weight  10/02/18 1450 130/79 - - 63 18 96 % -  10/02/18 1326 136/65 - - (!) 58 15 96 % -  10/02/18 1211 132/71 - - 65 11 98 % -  10/02/18 1200 132/71 - - (!) 57 17 97 % -  10/02/18 1130 126/74 - - (!) 54 18 95 % -  10/02/18 1100 128/68 - - (!) 59 19 96 % -  10/02/18 1030 129/66 - - (!) 52 13 93 % -  10/02/18 1000 125/64 - - (!) 50 14 98 % -  10/02/18 0952 121/66 - - (!) 50 15 95 % -  10/02/18 0919 137/71 - - (!) 54 13 97 % -  10/02/18 0843 (!) 148/63 97.9 F (36.6 C) Oral (!) 58 16 98 % -  10/02/18 0842 - - - - - - 63 kg    2:43 PM Reevaluation with update and discussion. After initial assessment and treatment, an updated evaluation reveals she is comfortable this time and has no further complaints.  She denies chest pain at this time.  Findings discussed with patient and husband, all questions answered. Daleen Bo   Medical Decision Making: Nonspecific chest pain, doubt ACS, PE or pneumonia.  No indication for further ED evaluation.  Patient is low risk for cardiac disease.  She is stable for  discharge with outpatient follow-up by her PCP.  She has persistent or worsening chest discomfort she should follow-up with cardiologist.  CRITICAL CARE-no Performed by: Daleen Bo  Nursing Notes Reviewed/ Care Coordinated Applicable Imaging Reviewed Interpretation of Laboratory Data incorporated into ED treatment  The patient appears reasonably screened and/or stabilized for discharge and I doubt any other medical condition or other [facility] requiring further screening, evaluation, or treatment in the ED at this time prior to discharge.  Plan: Home Medications-continue usual, use Tylenol for pain; Home Treatments-rest, fluids; return here if the recommended treatment, does not improve the symptoms; Recommended follow up-PCP, PRN    Final Clinical Impressions(s) / ED Diagnoses   Final diagnoses:  Nonspecific chest pain    ED Discharge Orders    None       Daleen Bo, MD 10/02/18 1452

## 2018-10-02 NOTE — ED Notes (Signed)
Urine sample at bedside if needed  

## 2019-07-28 ENCOUNTER — Ambulatory Visit (INDEPENDENT_AMBULATORY_CARE_PROVIDER_SITE_OTHER): Payer: Medicare Other | Admitting: Family Medicine

## 2019-07-28 ENCOUNTER — Encounter: Payer: Self-pay | Admitting: Family Medicine

## 2019-07-28 ENCOUNTER — Other Ambulatory Visit: Payer: Self-pay

## 2019-07-28 VITALS — BP 132/80 | HR 72 | Temp 97.8°F | Resp 16 | Ht 58.5 in | Wt 148.0 lb

## 2019-07-28 DIAGNOSIS — R209 Unspecified disturbances of skin sensation: Secondary | ICD-10-CM | POA: Diagnosis not present

## 2019-07-28 DIAGNOSIS — I10 Essential (primary) hypertension: Secondary | ICD-10-CM

## 2019-07-28 DIAGNOSIS — R7303 Prediabetes: Secondary | ICD-10-CM | POA: Diagnosis not present

## 2019-07-28 DIAGNOSIS — E785 Hyperlipidemia, unspecified: Secondary | ICD-10-CM | POA: Insufficient documentation

## 2019-07-28 DIAGNOSIS — I700 Atherosclerosis of aorta: Secondary | ICD-10-CM

## 2019-07-28 LAB — COMPREHENSIVE METABOLIC PANEL
ALT: 14 U/L (ref 0–35)
AST: 16 U/L (ref 0–37)
Albumin: 4.1 g/dL (ref 3.5–5.2)
Alkaline Phosphatase: 91 U/L (ref 39–117)
BUN: 19 mg/dL (ref 6–23)
CO2: 28 mEq/L (ref 19–32)
Calcium: 9.2 mg/dL (ref 8.4–10.5)
Chloride: 104 mEq/L (ref 96–112)
Creatinine, Ser: 0.72 mg/dL (ref 0.40–1.20)
GFR: 79.21 mL/min (ref >60.00)
Glucose, Bld: 102 mg/dL — ABNORMAL HIGH (ref 70–99)
Potassium: 3.8 mEq/L (ref 3.5–5.1)
Sodium: 141 mEq/L (ref 135–145)
Total Bilirubin: 0.5 mg/dL (ref 0.2–1.2)
Total Protein: 6.7 g/dL (ref 6.0–8.3)

## 2019-07-28 LAB — HEMOGLOBIN A1C: Hgb A1c MFr Bld: 5.8 % (ref 4.6–6.5)

## 2019-07-28 LAB — LIPID PANEL
Cholesterol: 147 mg/dL (ref 0–200)
HDL: 61.1 mg/dL (ref >39.00)
LDL Cholesterol: 60 mg/dL (ref 0–99)
NonHDL: 85.68
Total CHOL/HDL Ratio: 2
Triglycerides: 129 mg/dL (ref 0.0–149.0)
VLDL: 25.8 mg/dL (ref 0.0–40.0)

## 2019-07-28 LAB — VITAMIN B12: Vitamin B-12: 280 pg/mL (ref 211–911)

## 2019-07-28 LAB — TSH: TSH: 2.94 u[IU]/mL (ref 0.35–4.50)

## 2019-07-28 MED ORDER — GABAPENTIN 100 MG PO CAPS: 100.0000 mg | ORAL_CAPSULE | Freq: Every day | ORAL | 2 refills | Status: DC

## 2019-07-28 NOTE — Assessment & Plan Note (Addendum)
BP adequately controlled. No changes in current management. Recommend monitoring BP at home. Continue low-salt diet. 

## 2019-07-28 NOTE — Assessment & Plan Note (Addendum)
Continue Rosuvastatin 10 mg daily. I will give further recommendations according to FLP result.

## 2019-07-28 NOTE — Progress Notes (Signed)
HPI: [name] is a 74 y.o. female, who is here today to establish care.  Former PCP: Dr Karle Starch. Last preventive routine visit: 04/2016.  Chronic medical problems: HTN,HLD,GERD,back pain,constipation,prediabetes,and OA among some. 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 Completed 5 years of tamoxifen. She lives with her husband.  HTN: Dx'ed in 1996. She is on Metoprolol Succinate 25 mg daily and triamterene-HCTZ 37.5-25 mg daily. Denies severe/frequent headache, visual changes, chest pain, dyspnea, palpitation, claudication, focal weakness, or edema.  HLD: She is on Rosuvastatin 10 mg daily. Aortoiliac atherosclerosis seen on abdomen and pelvis CT on 07/08/17. She takes Aspirin 81 mg daily.  GERD: She takes Prilosec 40 mg daily as needed. Heartburn exacerbated by certain foods.  Constipation: She takes Miralax daily as needed and it is helping. Last colonoscopy last year.  04/2016 HgA1C 5.8. Negative for polydipsia,polyuria, or polyphagia.   She has not been exercising regularly for the past year. In general she follows a healthful diet. Former smoker, quit at 74 yo.  Concerns today: LLE pain.  Left lateral hip pain since 01/2018. It seems to be worse. Sometimes hip pain radiated to lateral aspect of left calf but other times she has just calf pain. Exacerbated by prolonged walking and standing. Burning like sensation. Denies lower back pain,saddle anesthesia,numbness,tingling,or bladder/bowel dysfunction.  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  Review of Systems  Constitutional: Negative for activity change, appetite change, fatigue and fever.  HENT: Negative for mouth sores, nosebleeds and sore throat.   Respiratory: Negative for cough and wheezing.   Gastrointestinal: Negative for abdominal pain, nausea and vomiting.       Negative for changes in bowel habits.  Genitourinary: Negative for decreased urine volume  and hematuria.  Musculoskeletal: Negative for gait problem.  Skin: Negative for pallor and rash.  Neurological: Negative for syncope, facial asymmetry and weakness.  Psychiatric/Behavioral: Negative for confusion.  Rest see pertinent positives and negatives per HPI.  Current Outpatient Medications on File Prior to Visit  Medication Sig Dispense Refill  . aspirin EC 81 MG tablet Take 81 mg by mouth daily.    . metoprolol succinate (TOPROL-XL) 25 MG 24 hr tablet Take 25 mg by mouth daily.    Marland Kitchen omeprazole (PRILOSEC) 40 MG capsule Take 40 mg by mouth daily.    . rosuvastatin (CRESTOR) 10 MG tablet Take 1 tablet (10 mg total) by mouth daily. 90 tablet 3  . triamterene-hydrochlorothiazide (MAXZIDE-25) 37.5-25 MG tablet Take 1 tablet by mouth daily.     No current facility-administered medications on file prior to visit.   Past Medical History:  Diagnosis Date  . Cataract   . DCIS (ductal carcinoma in situ) of breast   . Diverticular disease   . Hyperlipidemia   . Hypertension   . Mitral valve prolapse    Allergies  Allergen Reactions  . Losartan Swelling  . Ibuprofen Nausea And Vomiting  . Tape Rash    Family History  Problem Relation Age of Onset  . Hypertension Father   . Hypertension Sister   . Diabetes Brother     Social History   Socioeconomic History  . Marital status: Married    Spouse name: Not on file  . Number of children: Not on file  . Years of education: Not on file  . Highest education level: Not on file  Occupational History  . Not on file  Tobacco Use  . Smoking status: Never Smoker  . Smokeless  tobacco: Never Used  Substance and Sexual Activity  . Alcohol use: Yes    Alcohol/week: 0.0 - 1.0 standard drinks  . Drug use: No  . Sexual activity: Yes    Birth control/protection: Surgical  Other Topics Concern  . Not on file  Social History Narrative  . Not on file   Social Determinants of Health   Financial Resource Strain:   . Difficulty of  Paying Living Expenses:   Food Insecurity:   . Worried About Charity fundraiser in the Last Year:   . Arboriculturist in the Last Year:   Transportation Needs:   . Film/video editor (Medical):   Marland Kitchen Lack of Transportation (Non-Medical):   Physical Activity:   . Days of Exercise per Week:   . Minutes of Exercise per Session:   Stress:   . Feeling of Stress :   Social Connections:   . Frequency of Communication with Friends and Family:   . Frequency of Social Gatherings with Friends and Family:   . Attends Religious Services:   . Active Member of Clubs or Organizations:   . Attends Archivist Meetings:   Marland Kitchen Marital Status:     Vitals:   07/28/19 0816  BP: 132/80  Pulse: 72  Resp: 16  Temp: 97.8 F (36.6 C)  SpO2: 97%   Body mass index is 30.41 kg/m.  Physical Exam Vitals and nursing note reviewed.  Constitutional:      General: She is not in acute distress.    Appearance: She is well-developed.  HENT:     Head: Normocephalic and atraumatic.     Mouth/Throat:     Mouth: Mucous membranes are moist.     Pharynx: Oropharynx is clear.  Eyes:     Conjunctiva/sclera: Conjunctivae normal.     Pupils: Pupils are equal, round, and reactive to light.  Cardiovascular:     Rate and Rhythm: Normal rate and regular rhythm.     Pulses:          Dorsalis pedis pulses are 2+ on the right side and 2+ on the left side.     Heart sounds: No murmur heard.   Pulmonary:     Effort: Pulmonary effort is normal. No respiratory distress.     Breath sounds: Normal breath sounds.  Abdominal:     Palpations: Abdomen is soft. There is no hepatomegaly or mass.     Tenderness: There is no abdominal tenderness.  Musculoskeletal:     Left hip: No tenderness, bony tenderness or crepitus. Normal range of motion.       Legs:     Comments: No pain upon palpation of lateral aspect left hip or calf. No pain elicited with foot dorsiflexion.  Lymphadenopathy:     Cervical: No cervical  adenopathy.  Skin:    General: Skin is warm.     Findings: No erythema or rash.  Neurological:     Mental Status: She is alert and oriented to person, place, and time.     Cranial Nerves: No cranial nerve deficit.     Gait: Gait normal.  Psychiatric:        Mood and Affect: Mood and affect normal.     Comments: Well groomed, good eye contact.    ASSESSMENT AND PLAN:  Ms. Carrin was seen today for establish care.  Diagnoses and all orders for this visit:  Orders Placed This Encounter  Procedures  . Comprehensive metabolic panel  . Lipid  panel  . Hemoglobin A1c  . Vitamin B12  . TSH   Lab Results  Component Value Date   TSH 2.94 07/28/2019   Lab Results  Component Value Date   HGBA1C 5.8 07/28/2019   Lab Results  Component Value Date   VITAMINB12 280 07/28/2019   Lab Results  Component Value Date   CHOL 147 07/28/2019   HDL 61.10 07/28/2019   LDLCALC 60 07/28/2019   TRIG 129.0 07/28/2019   CHOLHDL 2 07/28/2019    Hyperlipidemia, unspecified Continue Rosuvastatin 10 mg daily. I will give further recommendations according to FLP result.   Hypertension, essential, benign BP adequately controlled. No changes in current management. Recommend monitoring BP at home. Continue low salt diet.  Prediabetes Healthy life style for primary prevention. Further recommendations according to HgA1C result.  Disturbance of skin sensation ? Meralgia paresthetica, radiculopathy among some to consider. Problem has been stable for a year, which is reassuring. For now she agrees with holding on lower back imaging and EMG. She would like to try pharmacologic treatment. Gabapentin 100 mg at bedtime. Side effects discussed.  -     gabapentin (NEURONTIN) 100 MG capsule; Take 1 capsule (100 mg total) by mouth at bedtime.  Atherosclerosis of aorta ([facility]) Seen on imaging. She is on Aspirin 81 mg daily and Rosuvastatin. LDL < 100.  Morbid obesity ([place]) We discussed benefits  of wt loss as well as adverse effects of obesity. Consistency with healthy diet and physical activity recommended.  Return in about 2 months (around 09/28/2019) for Leg pain.Marland Kitchen  Marcela Alatorre G. Martin[place], MD  [facility]. [place] office.

## 2019-07-28 NOTE — Assessment & Plan Note (Signed)
Healthy life style for primary prevention. Further recommendations according to HgA1C result.

## 2019-07-28 NOTE — Patient Instructions (Signed)
A few things to remember from today's visit:   Prediabetes - Plan: Hemoglobin A1c  Hypertension, essential, benign - Plan: Comprehensive metabolic panel  Hyperlipidemia, unspecified hyperlipidemia type - Plan: Comprehensive metabolic panel, Lipid panel  Disturbance of skin sensation - Plan: Vitamin B12, TSH, gabapentin (NEURONTIN) 100 MG capsule  Gabapentin 1 cap en la noche. La veo otra vez en 2 meses. Dolor radicular Radicular Pain El dolor radicular es un tipo de dolor que se extiende desde la espalda o el cuello a lo largo del nervio raqudeo. Los nervios raqudeos se originan en la mdula espinal y llegan a los msculos. El dolor radicular a veces se conoce como radiculopata, radiculitis o pinzamiento de un nervio. Si tiene este tipo de Social research officer, government, tambin puede sentir debilidad, adormecimiento u hormigueo en la zona del cuerpo a la que llega ese nervio. El dolor puede ser agudo y sentirse como un ardor. Segn el nervio raqudeo que est afectado, el dolor puede aparecer en los siguientes lugares:  Zona del cuello (dolor radicular cervical). Tambin puede Education officer, environmental, adormecimiento, debilidad u hormigueo [employer].  Zona de la mitad de la columna (dolor radicular torcico). Se siente en la espalda y en el pecho. Este tipo es poco frecuente.  Zona inferior de la espalda (dolor radicular lumbar). Se siente un dolor lumbar. Tambin puede Education officer, environmental, adormecimiento, debilidad u hormigueo en las nalgas o en las piernas. La citica es un tipo de dolor radicular lumbar que se extiende por la parte de atrs de la pierna. El dolor radicular se produce cuando uno de los nervios espinales se irrita o se aprieta (comprime). A menudo se debe a algo que ejerce presin sobre un nervio espinal, como uno de los huesos de la columna vertebral (vrtebras) o una de las almohadillas redondas que estn entre las vrtebras (discos intervertebrales). Esto puede deberse a lo siguiente:  Una lesin.  El  desgaste o el envejecimiento de un disco.  El crecimiento de un espoln seo que aprieta el [place]. El dolor radicular suele desaparecer cuando se siguen las indicaciones del mdico sobre cmo aliviarlo en la casa. Siga estas indicaciones en su casa: Control del dolor      Si se lo indican, aplique hielo sobre la zona afectada: ? Ponga el hielo en una bolsa plstica. ? Coloque una [employer] piel y Therapist, nutritional. ? Coloque el hielo durante 62minutos, 2 a 3veces por da.  Si se lo indican, aplique calor en la zona afectada con la frecuencia que le haya indicado el mdico. Use la fuente de calor que el mdico le recomiende, como una compresa de calor hmedo o una almohadilla trmica. ? Coloque una [employer] piel y la fuente de Freight forwarder. ? Aplique calor durante 20 a 56minutos. ? Retire la fuente de calor si la piel se pone de color rojo brillante. Esto es especialmente importante si no puede sentir dolor, calor o fro. Puede correr un riesgo mayor de sufrir quemaduras. Actividad   No permanezca sentado o acostado durante largos perodos.  Intente mantenerse lo ms activo posible. Pregntele al mdico qu tipo de ejercicio o actividad es mejor para usted.  Evite las actividades que empeoren el dolor, como doblarse y Lexicographer objetos.  No levante ningn objeto que pese ms de 10libras (4,5kg) o el lmite de peso que le hayan indicado, hasta que el mdico le diga que puede [place].  Use una tcnica adecuada al levantar objetos. La tcnica adecuada para levantar objetos consiste en doblar  las rodillas y levantarse.  Haga ejercicios de fuerza y flexibilidad solamente como se lo haya indicado el mdico o el fisioterapeuta. Indicaciones generales  [employer] de venta libre y los recetados solamente como se lo haya indicado el mdico.  Est atento a cualquier [employer] sntomas.  Concurra a todas las visitas de seguimiento como se lo haya indicado el mdico. Esto es  importante. ? El mdico lo puede enviar a un fisioterapeuta para Microbiologist. Comunquese con un mdico si:  El dolor y otros sntomas empeoran.  El medicamento no Production designer, theatre/television/film.  El dolor no mejora despus de unas semanas de cuidado Financial planner.  Tiene fiebre. Solicite ayuda inmediatamente si:  Scientist, clinical (histocompatibility and immunogenetics), adormecimiento o debilidad intensos.  Tiene dificultad con el control de la vejiga o los intestinos. Resumen  El dolor radicular es un tipo de dolor que se extiende desde la espalda o el cuello a lo largo del nervio raqudeo.  Si tiene dolor radicular, tambin puede sentir debilidad, adormecimiento u hormigueo en la zona del cuerpo a la que llega ese nervio.  El dolor puede ser agudo o sentirse como ardor.  El dolor radicular puede tratarse con hielo, calor, medicamentos o fisioterapia. Esta informacin no tiene Marine scientist el consejo del mdico. Asegrese de hacerle al mdico cualquier pregunta que tenga. Document Revised: 09/15/2017 Document Reviewed: 09/15/2017 Elsevier Patient Education  [employer].  If you need refills please call your pharmacy. Do not use My Chart to request refills or for acute issues that need immediate attention.    Please be sure medication list is accurate. If a new problem present, please set up appointment sooner than planned today.

## 2019-07-29 ENCOUNTER — Encounter: Payer: Self-pay | Admitting: Family Medicine

## 2019-09-29 ENCOUNTER — Ambulatory Visit: Payer: Medicare Other | Admitting: Family Medicine

## 2021-02-10 NOTE — Progress Notes (Signed)
ACUTE VISIT Chief Complaint  Patient presents with   Hand Pain   Foot Pain   HPI: [name] is a 76 y.o. female, who is here today complaining of joint pain as described above. Left 3rd finger trigger limitation of ROM,other fingers have been treated for similar problem with steroid injections.  Hand joint pain, exacerbated by manual activities like chopping and cooking. She has noted joint edema, no erythema.  She uses topical Voltaren. Poor tolerance to oral NSAID's.  Evaluated by rheumatologist, seen for hand arthralgias on 08/05/20. Rheumatologic work up in 04/2020. Mildly elevated CRP at 7.5 on 02/22/20, sed rate 24. Negative for fever,abnormal wt loss,or night sweats.  2 months ago she noted decreased sensation left foot, posterior aspect of heel. Problem has been stable. Negative for back pain or focal weakness.  On 07/28/2019 she was c/o left lateral hip pain sometimes hip pain radiated to lateral aspect of left calf and burning like sensation. She is not longer taking Gabapentin, symptoms have resolved.  HTN on Triamterene-HCTZ 37.5-25 mg daily and metoprolol succinate 25 mg daily. She does not check BP regularly. Negative for severe/frequent headache, visual changes, chest pain, dyspnea, palpitation, claudication, or edema.  HLD: She is on Crestor 10 mg daily. Aortic atherosclerosis seen on imaging, abdominal CT 06/2017.   Ref Range & Units 11 mo ago  LDL Direct <130 mg/dL 65   Total Cholesterol 25 - 199 MG/DL 154   Triglycerides 10 - 150 MG/DL 108   HDL Cholesterol 35 - 135 MG/DL 66   Total Chol / HDL Cholesterol <4.5 2.3   Non-HDL Cholesterol MG/DL 88    She is also on Aspirin 81 mg daily.  Review of Systems  Constitutional:  Negative for activity change, appetite change and fever.  HENT:  Negative for mouth sores and nosebleeds.   Respiratory:  Negative for cough and wheezing.   Gastrointestinal:  Negative for abdominal pain, nausea and  vomiting.       Negative for changes in bowel habits.  Endocrine: Negative for cold intolerance, heat intolerance, polydipsia, polyphagia and polyuria.  Genitourinary:  Negative for decreased urine volume and hematuria.  Skin:  Negative for pallor and rash.  Neurological:  Negative for syncope and facial asymmetry.  Rest see pertinent positives and negatives per HPI.  Current Outpatient Medications on File Prior to Visit  Medication Sig Dispense Refill   aspirin EC 81 MG tablet Take 81 mg by mouth daily.     metoprolol succinate (TOPROL-XL) 25 MG 24 hr tablet Take 25 mg by mouth daily.     omeprazole (PRILOSEC) 40 MG capsule Take 40 mg by mouth daily.     rosuvastatin (CRESTOR) 10 MG tablet Take 1 tablet (10 mg total) by mouth daily. 90 tablet 3   triamterene-hydrochlorothiazide (MAXZIDE-25) 37.5-25 MG tablet Take 1 tablet by mouth daily.     No current facility-administered medications on file prior to visit.   Past Medical History:  Diagnosis Date   Cataract    DCIS (ductal carcinoma in situ) of breast    Diverticular disease    Hyperlipidemia    Hypertension    Mitral valve prolapse    Allergies  Allergen Reactions   Losartan Swelling   Ibuprofen Nausea And Vomiting   Tape Rash    Social History   Socioeconomic History   Marital status: Married    Spouse name: Not on file   Number of children: Not on file   Years of education: Not on  file   Highest education level: Not on file  Occupational History   Not on file  Tobacco Use   Smoking status: Never   Smokeless tobacco: Never  Substance and Sexual Activity   Alcohol use: Yes    Alcohol/week: 0.0 - 1.0 standard drinks   Drug use: No   Sexual activity: Yes    Birth control/protection: Surgical  Other Topics Concern   Not on file  Social History Narrative   Not on file   Social Determinants of Health   Financial Resource Strain: Not on file  Food Insecurity: Not on file  Transportation Needs: Not on file   Physical Activity: Not on file  Stress: Not on file  Social Connections: Not on file   Vitals:   02/11/21 0921  BP: 136/80  Pulse: 77  Resp: 16  SpO2: 97%   Body mass index is 30.84 kg/m.  Physical Exam Vitals and nursing note reviewed.  Constitutional:      General: She is not in acute distress.    Appearance: She is well-developed.  HENT:     Head: Normocephalic and atraumatic.     Mouth/Throat:     Mouth: Mucous membranes are moist.     Pharynx: Oropharynx is clear.  Eyes:     Conjunctiva/sclera: Conjunctivae normal.  Cardiovascular:     Rate and Rhythm: Normal rate and regular rhythm.     Pulses:          Dorsalis pedis pulses are 2+ on the right side and 2+ on the left side.     Heart sounds: No murmur heard. Pulmonary:     Effort: Pulmonary effort is normal. No respiratory distress.     Breath sounds: Normal breath sounds.  Abdominal:     Palpations: Abdomen is soft. There is no hepatomegaly or mass.     Tenderness: There is no abdominal tenderness.  Musculoskeletal:     Left ankle:     Left Achilles Tendon: No tenderness or defects. Thompson's test negative.       Feet:     Comments: Some Heberden's node (DIP) and Bouchard's nodes (PIP) bilateral. Left 3rd finger: Nodular like pattern palpated under MCP joint, mildly tender. Limited active extension, clicking sensation upon flexion and extension movement.  No signs of synovitis.   Lymphadenopathy:     Cervical: No cervical adenopathy.  Skin:    General: Skin is warm.     Findings: No erythema or rash.  Neurological:     Mental Status: She is alert and oriented to person, place, and time.     Cranial Nerves: No cranial nerve deficit.     Gait: Gait normal.     Comments: Decreased vibration sensation heels, absent monofilament posterior aspect of left heel.No focal weakness.  Psychiatric:     Comments: Well groomed, good eye contact.   ASSESSMENT AND PLAN:  [name] was seen today for hand pain  and foot pain.  Diagnoses and all orders for this visit: Orders Placed This Encounter  Procedures   C-reactive protein   TSH   Vitamin B12   Lipid panel   Comprehensive metabolic panel   Ambulatory referral to Sports Medicine   Lab Results  Component Value Date   CRP 1.1 02/11/2021   Lab Results  Component Value Date   UMPNTIRW43 154 02/11/2021   Lab Results  Component Value Date   TSH 3.20 02/11/2021   Lab Results  Component Value Date   CREATININE 0.75 02/11/2021  BUN 17 02/11/2021   NA 139 02/11/2021   K 3.7 02/11/2021   CL 100 02/11/2021   CO2 30 02/11/2021   Lab Results  Component Value Date   ALT 18 02/11/2021   AST 20 02/11/2021   ALKPHOS 83 02/11/2021   BILITOT 0.5 02/11/2021   CRP elevated Possible causes discussed. Further recommendations will be given according to lab results.  Trigger finger, left middle finger We discussed Dx.prognosis,and treatment options. Sport med referral placed.  Paresthesia We discussed possible etiologies. For now she prefers to hold on neuro referral.  Appropriate foot care discussed. EMG will be useful, I think it can be ordered by sport medicine provider. Monitor for new symptoms. Instructed about warning signs.  Hypertension, essential, benign BP adequately controlled. Continue Triamterene-HCTZ 37.5-25 mg daily and metoprolol succinate 25 mg daily. Low salt/DASH diet to continue.  Hyperlipidemia, unspecified Continue Crestor 10 mg daily and low fat diet. Further recommendations according to FLP result.   Atherosclerosis of aorta ([place]) Seen on abdominal CT in 2019. Continue Crestor 10 mg daily and Aspirin 81 mg daily.  Arthralgia of both hands We discussed possible etiologies. Rheumatologic work up negative for RA. Most likely OA, educated about Dx and prognosis. She has not tolerated some NSAID's in the past, she agrees with trying Meloxicam 7.5 mg daily prn, we discussed some side effects.  I spent  a total of 45 minutes in both face to face and non face to face activities for this visit on the date of this encounter. During this time history was obtained and documented, examination was performed, prior labs/imaging reviewed, and assessment/plan discussed.  Return in about 6 months (around 08/11/2021).  Barbara Gladson G. [place], MD  [facility]. [place] office.

## 2021-02-11 ENCOUNTER — Encounter: Payer: Self-pay | Admitting: Family Medicine

## 2021-02-11 ENCOUNTER — Ambulatory Visit (INDEPENDENT_AMBULATORY_CARE_PROVIDER_SITE_OTHER): Payer: Medicare Other | Admitting: Family Medicine

## 2021-02-11 VITALS — BP 136/80 | HR 77 | Resp 16 | Ht 58.5 in | Wt 150.1 lb

## 2021-02-11 DIAGNOSIS — E785 Hyperlipidemia, unspecified: Secondary | ICD-10-CM | POA: Diagnosis not present

## 2021-02-11 DIAGNOSIS — R202 Paresthesia of skin: Secondary | ICD-10-CM | POA: Diagnosis not present

## 2021-02-11 DIAGNOSIS — R7982 Elevated C-reactive protein (CRP): Secondary | ICD-10-CM | POA: Diagnosis not present

## 2021-02-11 DIAGNOSIS — M25541 Pain in joints of right hand: Secondary | ICD-10-CM | POA: Diagnosis not present

## 2021-02-11 DIAGNOSIS — I10 Essential (primary) hypertension: Secondary | ICD-10-CM

## 2021-02-11 DIAGNOSIS — M65332 Trigger finger, left middle finger: Secondary | ICD-10-CM | POA: Diagnosis not present

## 2021-02-11 DIAGNOSIS — M25542 Pain in joints of left hand: Secondary | ICD-10-CM

## 2021-02-11 DIAGNOSIS — I700 Atherosclerosis of aorta: Secondary | ICD-10-CM

## 2021-02-11 LAB — LIPID PANEL
Cholesterol: 163 mg/dL (ref 0–200)
HDL: 70 mg/dL (ref >39.00)
LDL Cholesterol: 69 mg/dL (ref 0–99)
NonHDL: 92.79
Total CHOL/HDL Ratio: 2
Triglycerides: 117 mg/dL (ref 0.0–149.0)
VLDL: 23.4 mg/dL (ref 0.0–40.0)

## 2021-02-11 LAB — C-REACTIVE PROTEIN: CRP: 1.1 mg/dL (ref 0.5–20.0)

## 2021-02-11 LAB — COMPREHENSIVE METABOLIC PANEL
ALT: 18 U/L (ref 0–35)
AST: 20 U/L (ref 0–37)
Albumin: 4.5 g/dL (ref 3.5–5.2)
Alkaline Phosphatase: 83 U/L (ref 39–117)
BUN: 17 mg/dL (ref 6–23)
CO2: 30 mEq/L (ref 19–32)
Calcium: 9.7 mg/dL (ref 8.4–10.5)
Chloride: 100 mEq/L (ref 96–112)
Creatinine, Ser: 0.75 mg/dL (ref 0.40–1.20)
GFR: 77.87 mL/min (ref >60.00)
Glucose, Bld: 110 mg/dL — ABNORMAL HIGH (ref 70–99)
Potassium: 3.7 mEq/L (ref 3.5–5.1)
Sodium: 139 mEq/L (ref 135–145)
Total Bilirubin: 0.5 mg/dL (ref 0.2–1.2)
Total Protein: 8 g/dL (ref 6.0–8.3)

## 2021-02-11 LAB — VITAMIN B12: Vitamin B-12: 757 pg/mL (ref 211–911)

## 2021-02-11 LAB — TSH: TSH: 3.2 u[IU]/mL (ref 0.35–5.50)

## 2021-02-11 MED ORDER — MELOXICAM 7.5 MG PO TABS
7.5000 mg | ORAL_TABLET | Freq: Every day | ORAL | 0 refills | Status: DC | PRN
Start: 2021-02-11 — End: 2021-10-01

## 2021-02-11 NOTE — Assessment & Plan Note (Signed)
Seen on abdominal CT in 2019. Continue Crestor 10 mg daily and Aspirin 81 mg daily.

## 2021-02-11 NOTE — Assessment & Plan Note (Signed)
BP adequately controlled. Continue Triamterene-HCTZ 37.5-25 mg daily and metoprolol succinate 25 mg daily. Low salt/DASH diet to continue.

## 2021-02-11 NOTE — Assessment & Plan Note (Signed)
Continue Crestor 10 mg daily and low fat diet. Further recommendations according to FLP result. 

## 2021-02-11 NOTE — Assessment & Plan Note (Signed)
We discussed possible etiologies. Rheumatologic work up negative for RA. Most likely OA, educated about Dx and prognosis. She has not tolerated some NSAID's in the past, she agrees with trying Meloxicam 7.5 mg daily prn, we discussed some side effects.

## 2021-02-11 NOTE — Patient Instructions (Signed)
A few things to remember from today's visit:   Arthralgia of both hands  CRP elevated - Plan: C-reactive protein  Trigger finger, left middle finger - Plan: Ambulatory referral to Sports Medicine  Paresthesia - Plan: TSH, Vitamin B12  Atherosclerosis of aorta (HCC)  Hypertension, essential, benign  Hyperlipidemia, unspecified hyperlipidemia type - Plan: Lipid panel, Comprehensive metabolic panel  If you need refills please call your pharmacy. Do not use My Chart to request refills or for acute issues that need immediate attention.   Meloxicam diario cuando lo necesite para el dolor de last manos.  Please be sure medication list is accurate. If a new problem present, please set up appointment sooner than planned today.  Artrosis Osteoarthritis La artrosis es un tipo de artritis. Esta afeccin produce dolor o enfermedad en las articulaciones. La artrosis afecta al tejido que cubre los extremos de los huesos en las articulaciones (cartlago). El cartlago acta como amortiguador [employer] y los ayuda a moverse con suavidad. La artrosis se presenta cuando el cartlago de las articulaciones se gasta. A veces, la artrosis se denomina artritis por uso y desgaste. La artrosis es la forma ms frecuente de artritis. A menudo, afecta a las [employer]. Es una afeccin que empeora con el [place]. Las articulaciones afectadas con mayor frecuencia por esta afeccin se encuentran en los dedos de Marriott, los dedos de [employer]theon, las caderas, las rodillas y la columna vertebral, incluyendo el cuello y la parte inferior de la espalda. Cules son las causas? Esta afeccin es causada por el desgaste del cartlago que cubre los extremos de [place]. Qu incrementa el riesgo? Los siguientes factores pueden hacer que sea ms propenso a Armed forces training and education officer afeccin: Ser mayor de 80 aos. Obesidad. Uso excesivo de Water engineer. Lesin pasada de Insurance claims handler. Ciruga pasada en una  articulacin. Antecedentes familiares de artrosis. Cules son los signos o sntomas? Los principales sntomas de esta enfermedad son dolor, hinchazn y Advertising account executive. Otros sntomas pueden incluir: Agrandamiento de Water engineer. Aumento del dolor y dao adicional causado por pequeos trozos de [employer] o Database administrator que se desprenden y flotan dentro de Water engineer. Formacin de pequeos depsitos de hueso (osteofitos) en los extremos de Water engineer. Una sensacin de chirrido o raspado dentro de la articulacin al moverla. Sonidos de chasquido o crujido al [employer]. Dificultad para caminar o hacer ejercicio. Incapacidad para agarrar objetos, girar las manos o [employer] movimientos de las manos y los dedos. Cmo se diagnostica? Esta afeccin se puede diagnosticar en funcin de lo siguiente: Sus antecedentes mdicos. Un examen fsico. Sus sntomas. Radiografas de la(s) articulacin(es) afectada(s). Anlisis de sangre para descartar otros tipos de artritis. Cmo se trata? No hay cura para esta enfermedad, pero el tratamiento puede ayudar a Financial controller y Teacher, English as a foreign language el funcionamiento de Water engineer. El tratamiento puede incluir una combinacin de terapias, [place] las siguientes: Tcnicas de alivio del dolor, como: Aplicacin de calor y fro en la articulacin. Masajes. Una forma de psicoterapia llamada terapia cognitivo conductual (TCC). Esta terapia le ayuda a Secretary/administrator y a Optometrist un seguimiento de los cambios que hace. Analgsicos y antiinflamatorios. Los medicamentos pueden tomarse por boca o Kellogg. Incluyen los siguientes: Antiinflamatorios no esteroideos (AINE), como el ibuprofeno. Medicamentos recetados. Antiinflamatorios fuertes (corticoesteroides). Ciertos suplementos nutricionales. Un programa de ejercicios recomendado. Puede trabajar con un fisioterapeuta. Dispositivos de [place], como un dispositivo ortopdico, una frula,  un guante especial o un bastn. Un plan de  control del peso. [place], como: Imelda Pillow. Se hace para volver a posicionar los [employer] Services y Best boy o para Charity fundraiser los trozos sueltos de hueso y Database administrator. Ciruga de reemplazo articular. Es posible que necesite esta ciruga si tiene una artrosis [place]. Siga estas instrucciones en su casa: [place] articulaciones afectadas como se lo haya indicado el mdico. Haga actividad fsica como se lo haya indicado el mdico. Este puede recomendarle tipos especficos de ejercicios, por ejemplo: Ejercicios de fortalecimiento. Se realizan para fortalecer los msculos que sostienen las articulaciones afectadas por la artritis. Ejercicios aerbicos. Son ejercicios, como caminar a paso ligero o hacer gimnasia [place] acutica, que aumentan la frecuencia cardaca. Actividades de amplitud de movimientos. Estos ayudan a que las articulaciones se muevan con ms facilidad. Ejercicios de equilibrio y [place]. Control del dolor, la rigidez y la hinchazn   Si se lo indican, aplique calor en la zona afectada con la frecuencia que le haya indicado el mdico. Use la fuente de calor que el mdico le recomiende, como una compresa de calor hmedo o una almohadilla trmica. Si tiene un dispositivo de [employer] se puede quitar, [employer] Energy segn lo indicado por su mdico. Colquese una toalla entre la piel y la fuente de Freight forwarder. Si el mdico le indica que no se quite el dispositivo de HCA Inc se Passenger transport manager, coloque una toalla entre el dispositivo de [place] y la fuente de Freight forwarder. Aplique calor durante 20 a 30 minutos. Retire la fuente de calor si la piel se pone de color rojo brillante. Esto es especialmente importante si no puede sentir dolor, calor o fro. Puede correr un riesgo mayor de sufrir quemaduras. Si se lo indican, aplique hielo sobre la zona afectada. Para hacer esto: Si tiene un dispositivo de ayuda que se puede quitar, quteselo  segn lo indicado por su mdico. Ponga el hielo en una bolsa plstica. Coloque una toalla entre la piel y Therapist, nutritional. Si el mdico le indica que no se quite el dispositivo de HCA Inc se aplica hielo, coloque una toalla entre el dispositivo de [place] y la bolsa de hielo. Aplique el hielo durante 20 minutos, 2 o 3 veces por da. Mueva los dedos de las manos o de los pies con frecuencia para reducir la rigidez y la hinchazn. Cuando est sentado o acostado, levante (eleve) la zona lesionada por encima del nivel del corazn. Instrucciones generales Use los medicamentos de venta libre y los recetados solamente como se lo haya indicado el mdico. Mantenga un peso saludable. Siga las instrucciones de su mdico con respecto al control de su peso. No consuma ningn producto que contenga nicotina o tabaco, como cigarrillos, cigarrillos electrnicos y tabaco de Higher education careers adviser. Si necesita ayuda para dejar de fumar, consulte al [employer]. Use los dispositivos de A[employer]ren Corporation se lo haya indicado el mdico. Concurra a todas las visitas de seguimiento como se lo haya indicado el mdico. Esto es importante. Dnde buscar ms informacin [employer]heed Martin of Arthritis and Musculoskeletal and Skin Diseases ([place] Artritis y [place] Musculoesquelticas y Insurance underwriter): www.niams.SouthExposed.es [employer]heed Martin on Aging ([place]): http://kim-miller.com/ American College of Rheumatology ([place]): www.rheumatology.org Comunquese con un mdico si: Tiene enrojecimiento, hinchazn o sensacin de calor que empeora en una articulacin. Tiene fiebre y siente dolor en la articulacin o el msculo. Presenta una erupcin cutnea. Tiene dificultad para Cal[employer] Corporation cotidianas. Solicite ayuda de inmediato si: Siente dolor que empeora y no se alivia con los analgsicos.  Resumen La artrosis es una clase de artritis que afecta el tejido que  cubre los extremos de los huesos en las articulaciones (cartlago). Esta afeccin es causada por el desgaste del cartlago que cubre los extremos de [place]. Los sntomas principales de esta enfermedad son dolor, hinchazn y Advertising account executive. No hay cura para esta enfermedad, pero el tratamiento puede ayudar a Financial controller y Teacher, English as a foreign language el funcionamiento de Water engineer. Esta informacin no tiene Marine scientist el consejo del mdico. Asegrese de hacerle al mdico cualquier pregunta que tenga. Document Revised: 02/20/2019 Document Reviewed: 02/20/2019 Elsevier Patient Education  2022 [employer].

## 2021-02-13 ENCOUNTER — Encounter: Payer: Self-pay | Admitting: Family Medicine

## 2021-02-27 NOTE — Progress Notes (Signed)
Subjective:    CC: L middle finger trigger finger  HPI: Pt is a 76 y/o female c/o L middle 3rd trigger finger x 6 months. She locates her pain to the L 3rd finger. Pt c/o finger getting stuck into flexion.  She had a similar problem with her second digit on her left hand previously which did benefit from a steroid injection.  Additionally she notes some paresthesias of the left posterior calcaneus.  This is been ongoing for few months.  She notes some numbness and some numb tingly sensation.  She denies any pain or weakness.  She feels pretty well otherwise.  No back pain.  Swelling: yes- and is very painful Treatments tried: meloxicam,   Additionally she notes some right hand pain at the base of the thumb.  She notes soreness and stiffness.  She not had much treatment for this yet aside from Voltaren gel but is willing to consider hand physical therapy.  She has tried some  Pertinent review of Systems: No fevers or chills  Relevant historical information: Prediabetes.   Objective:    Vitals:   02/28/21 0922  BP: 140/88  Pulse: 66  SpO2: 97%   General: Well Developed, well nourished, and in no acute distress.   MSK:  Left hand normal-appearing Triggering present with flexion of third digit at PIP. Palpable nodule present at the third MCP palmar aspect.  Tender palpation. Flexion and extension strength are intact.  Posterior calcaneus normal-appearing Mild decrease sensation to light touch. Lower extremity strength reflexes and sensation are intact. Negative slump test.   Lab and Radiology Results  Procedure: Real-time Ultrasound Guided Injection of left hand third digit MCP tendon sheath at A1 pulley (trigger finger injection) Device: Philips Affiniti 50G Images permanently stored and available for review in PACS Verbal informed consent obtained.  Discussed risks and benefits of procedure. Warned about infection bleeding damage to structures skin  hypopigmentation and fat atrophy among others. Patient expresses understanding and agreement Time-out conducted.   Noted no overlying erythema, induration, or other signs of local infection.   Skin prepped in a sterile fashion.   Local anesthesia: Topical Ethyl chloride.   With sterile technique and under real time ultrasound guidance: 2 mg of Kenalog and 1 mL lidocaine injected into tendon sheath at A1 pulley. Fluid seen entering the tendon sheath.   Completed without difficulty   Pain immediately resolved suggesting accurate placement of the medication.   Advised to call if fevers/chills, erythema, induration, drainage, or persistent bleeding.   Images permanently stored and available for review in the ultrasound unit.  Impression: Technically successful ultrasound guided injection.      Impression and Recommendations:    Assessment and Plan: 76 y.o. female with left trigger finger third digit.  Plan for injection today and treatment with double Band-Aid splint.. Recheck in about 6 weeks especially if not improved.  Paresthesias left heel.  Etiology is unclear.  Certainly could be atypical presentation of lumbar radiculopathy.  I think more likely is peripheral neuropathy.  We discussed that if this needed to be worked up further a nerve conduction study certainly would be the next step although this is an uncomfortable obnoxious procedure and after discussion  PDMP not reviewed this encounter. Orders Placed This Encounter  Procedures   [place] LIMITED JOINT SPACE STRUCTURES UP LEFT(NO LINKED CHARGES)    Standing Status:   Future    Number of Occurrences:   1    Standing Expiration Date:  08/28/2021    Order Specific Question:   Reason for Exam (SYMPTOM  OR DIAGNOSIS REQUIRED)    Answer:   left hand pain    Order Specific Question:   Preferred imaging location?    Answer:   [place]   Ambulatory referral to Physical Therapy    Referral Priority:   Routine     Referral Type:   Physical Medicine    Referral Reason:   Specialty Services Required    Requested Specialty:   Physical Therapy    Number of Visits Requested:   1   No orders of the defined types were placed in this encounter.   Discussed warning signs or symptoms. Please see discharge instructions. Patient expresses understanding.    The above documentation has been reviewed and is accurate and complete Lynne Leader, M.D.

## 2021-02-28 ENCOUNTER — Ambulatory Visit: Payer: Self-pay

## 2021-02-28 ENCOUNTER — Ambulatory Visit (INDEPENDENT_AMBULATORY_CARE_PROVIDER_SITE_OTHER): Payer: Medicare Other | Admitting: Family Medicine

## 2021-02-28 VITALS — BP 140/88 | HR 66 | Ht 58.5 in | Wt 153.6 lb

## 2021-02-28 DIAGNOSIS — R202 Paresthesia of skin: Secondary | ICD-10-CM

## 2021-02-28 DIAGNOSIS — G8929 Other chronic pain: Secondary | ICD-10-CM

## 2021-02-28 DIAGNOSIS — M65332 Trigger finger, left middle finger: Secondary | ICD-10-CM | POA: Diagnosis not present

## 2021-02-28 DIAGNOSIS — M79642 Pain in left hand: Secondary | ICD-10-CM

## 2021-02-28 NOTE — Patient Instructions (Addendum)
Thank you for coming in today.   You received a steroid injection in your left hand today. Seek immediate medical attention if the joint becomes red, extremely painful, or is oozing fluid.   Use the double band-aid splint  I've referred you to Physical Therapy.  Let us know if you don't hear from them in one week.   Recheck back as needed

## 2021-03-26 ENCOUNTER — Telehealth: Payer: Self-pay | Admitting: Family Medicine

## 2021-03-26 MED ORDER — METOPROLOL SUCCINATE ER 25 MG PO TB24: 25.0000 mg | ORAL_TABLET | Freq: Every day | ORAL | 2 refills | Status: DC

## 2021-03-26 MED ORDER — TRIAMTERENE-HCTZ 37.5-25 MG PO TABS: 1.0000 | ORAL_TABLET | Freq: Every day | ORAL | 2 refills | Status: DC

## 2021-03-26 MED ORDER — ROSUVASTATIN CALCIUM 10 MG PO TABS: 10.0000 mg | ORAL_TABLET | Freq: Every day | ORAL | 2 refills | Status: DC

## 2021-03-26 NOTE — Telephone Encounter (Signed)
Rx's sent in. °

## 2021-03-26 NOTE — Telephone Encounter (Signed)
Daughter call and she need refills on metoprolol succinate (TOPROL-XL) 25 MG 24 hr tablet ,osuvastatin (CRESTOR) 10 MG tablet and triamterene-hydrochlorothiazide (MAXZIDE-25) 37.5-25 MG tablet  sent to  ?[facility], [place]. Phone:  [phone]  ?Fax:  [phone]  ?  ?She stated pt is out off meds ?

## 2021-04-02 ENCOUNTER — Telehealth: Payer: Self-pay | Admitting: Family Medicine

## 2021-04-02 LAB — HM MAMMOGRAPHY

## 2021-04-02 NOTE — Telephone Encounter (Signed)
Left message for patient to call back and schedule Medicare Annual Wellness Visit (AWV) either virtually or in office. Left  my Herbie Drape number [phone] ? ? ?awvi 03/27/14 per [facility]  ? please schedule at anytime with [facility] Nurse Health Advisor 1 or 2 ? ? ?This should be a 45 minute visit.  ?

## 2021-04-04 ENCOUNTER — Encounter: Payer: Self-pay | Admitting: Family Medicine

## 2021-04-11 ENCOUNTER — Ambulatory Visit (INDEPENDENT_AMBULATORY_CARE_PROVIDER_SITE_OTHER): Payer: Medicare Other

## 2021-04-11 VITALS — BP 120/62 | HR 65 | Temp 98.6°F | Ht <= 58 in | Wt 148.1 lb

## 2021-04-11 DIAGNOSIS — Z0000 Encounter for general adult medical examination without abnormal findings: Secondary | ICD-10-CM | POA: Diagnosis not present

## 2021-04-11 DIAGNOSIS — E2839 Other primary ovarian failure: Secondary | ICD-10-CM

## 2021-04-11 NOTE — Progress Notes (Signed)
? ?Subjective:  ? Barbara Washington is a 76 y.o. female who presents for Medicare Annual (Subsequent) preventive examination. ? ?Review of Systems    ? ?   ?Objective:  ?  ?Today's Vitals  ? 04/11/21 0825  ?BP: 120/62  ?Pulse: 65  ?Temp: 98.6 ?F (37 ?C)  ?TempSrc: Oral  ?SpO2: 97%  ?Weight: 148 lb 1.6 oz (67.2 kg)  ?Height: '4\' 10"'$  (1.473 m)  ? ?Body mass index is 30.95 kg/m?. ? ?Advanced Directives 04/11/2021 10/02/2018 05/04/2016 08/01/2015 07/17/2014 07/17/2014  ?Does Patient Have a Medical Advance Directive? No No Yes No No No  ?Type of Advance Directive - - Living will - - -  ?Would patient like information on creating a medical advance directive? No - Patient declined Yes (ED - Information included in AVS) - - No - patient declined information -  ? ? ?Current Medications (verified) ?Outpatient Encounter Medications as of 04/11/2021  ?Medication Sig  ? aspirin EC 81 MG tablet Take 81 mg by mouth daily.  ? meloxicam (MOBIC) 7.5 MG tablet Take 1 tablet (7.5 mg total) by mouth daily as needed for pain.  ? metoprolol succinate (TOPROL-XL) 25 MG 24 hr tablet Take 1 tablet (25 mg total) by mouth daily.  ? omeprazole (PRILOSEC) 40 MG capsule Take 40 mg by mouth daily.  ? rosuvastatin (CRESTOR) 10 MG tablet Take 1 tablet (10 mg total) by mouth daily.  ? triamterene-hydrochlorothiazide (MAXZIDE-25) 37.5-25 MG tablet Take 1 tablet by mouth daily.  ? ?No facility-administered encounter medications on file as of 04/11/2021.  ? ? ?Allergies (verified) ?Losartan, Ibuprofen, and Tape  ? ?History: ?Past Medical History:  ?Diagnosis Date  ? Cataract   ? DCIS (ductal carcinoma in situ) of breast   ? Diverticular disease   ? Hyperlipidemia   ? Hypertension   ? Mitral valve prolapse   ? ?Past Surgical History:  ?Procedure Laterality Date  ? ABDOMINAL HYSTERECTOMY  2008  ? ABDOMINAL HYST - NEW [place]  ? BREAST SURGERY    ? WISCONSIN  ? CATARACT EXTRACTION, BILATERAL    ? EYE SURGERY    ? TOTAL ABDOMINAL HYSTERECTOMY W/ BILATERAL  SALPINGOOPHORECTOMY    ? ?Family History  ?Problem Relation Age of Onset  ? Hypertension Father   ? Hypertension Sister   ? Diabetes Brother   ? ?Social History  ? ?Socioeconomic History  ? Marital status: Married  ?  Spouse name: Not on file  ? Number of children: Not on file  ? Years of education: Not on file  ? Highest education level: Not on file  ?Occupational History  ? Not on file  ?Tobacco Use  ? Smoking status: Never  ? Smokeless tobacco: Never  ?Substance and Sexual Activity  ? Alcohol use: Yes  ?  Alcohol/week: 0.0 - 1.0 standard drinks  ? Drug use: No  ? Sexual activity: Yes  ?  Birth control/protection: Surgical  ?Other Topics Concern  ? Not on file  ?Social History Narrative  ? Not on file  ? ?Social Determinants of Health  ? ?Financial Resource Strain: Low Risk   ? Difficulty of Paying Living Expenses: Not hard at all  ?Food Insecurity: No Food Insecurity  ? Worried About Charity fundraiser in the Last Year: Never true  ? Ran Out of Food in the Last Year: Never true  ?Transportation Needs: No Transportation Needs  ? Lack of Transportation (Medical): No  ? Lack of Transportation (Non-Medical): No  ?Physical Activity: Sufficiently Active  ?  Days of Exercise per Week: 3 days  ? Minutes of Exercise per Session: 60 min  ?Stress: No Stress Concern Present  ? Feeling of Stress : Not at all  ?Social Connections: Socially Integrated  ? Frequency of Communication with Friends and Family: More than three times a week  ? Frequency of Social Gatherings with Friends and Family: More than three times a week  ? Attends Religious Services: More than 4 times per year  ? Active Member of Clubs or Organizations: Yes  ? Attends Archivist Meetings: More than 4 times per year  ? Marital Status: Married  ? ? ? ?Clinical Intake: ? ? ?Diabetic?  No ? ?Interpreter Needed?: Yes ?Interpreter Agency: [place] ?Interpreter Name: Chesley Mires ?Interpreter ID: [email or web address] ?Patient Declined Interpreter : No ?Patient signed  [place] waiver: No ?Activities of Daily Living ?In your present state of health, do you have any difficulty performing the following activities: 04/11/2021 02/11/2021  ?Hearing? N N  ?Vision? N N  ?Difficulty concentrating or making decisions? N N  ?Walking or climbing stairs? - N  ?Dressing or bathing? N N  ?Doing errands, shopping? N N  ?Preparing Food and eating ? N -  ?Using the Toilet? N -  ?In the past six months, have you accidently leaked urine? N -  ?Do you have problems with loss of bowel control? N -  ?Managing your Medications? N -  ?Managing your Finances? N -  ?Housekeeping or managing your Housekeeping? N -  ?Some recent data might be hidden  ? ? ?Patient Care Team: ?Martinique, Betty G, MD as PCP - General (Family Medicine) ?Terrance Mass, MD (Inactive) as Consulting Physician (Gynecology) ? ?Indicate any recent Medical Services you may have received from other than [facility] providers in the past year (date may be approximate). ? ?   ?Assessment:  ? This is a routine wellness examination for [facility]. ? ?Hearing/Vision screen ?Hearing Screening - Comments:: No difficulty hearing ?Vision Screening - Comments:: Wears glasses. Followed by [facility] ? ?Dietary issues and exercise activities discussed: ?Exercise limited by: None identified ? ? Goals Addressed   ? ?  ?  ?  ?  ?  ? This Visit's Progress  ?   Eat better (pt-stated)     ?   I Would like to travel more. ?  ?   Increase physical activity     ? ?  ? ?Depression Screen ?PHQ 2/9 Scores 04/11/2021 02/11/2021 07/29/2019 05/04/2016  ?PHQ - 2 Score 0 0 0 0  ?  ?Fall Risk ?Fall Risk  04/11/2021 02/11/2021 05/04/2016  ?Falls in the past year? 0 0 No  ?Number falls in past yr: 0 - -  ?Injury with Fall? 0 - -  ?Risk for fall due to : No Fall Risks - -  ? ? ?FALL RISK PREVENTION PERTAINING TO THE HOME: ? ?Any stairs in or around the home? Yes  ?If so, are there any without handrails? No  ?Home free of loose throw rugs in walkways, pet beds, electrical cords, etc?  Yes  ?Adequate lighting in your home to reduce risk of falls? Yes  ? ?ASSISTIVE DEVICES UTILIZED TO PREVENT FALLS: ? ?Life alert? No  ?Use of a cane, walker or w/c? No  ?Grab bars in the bathroom? Yes  ?Shower chair or bench in shower? Yes  ?Elevated toilet seat or a handicapped toilet? No  ? ?TIMED UP AND GO: ? ?Was the test performed? Yes .  ?Length  of time to ambulate 10 feet: 5 sec.  ? ?Gait steady and fast without use of assistive device ? ?Cognitive Function: ?  ?  ?6CIT Screen 04/11/2021  ?What Year? 0 points  ?What month? 0 points  ?What time? 0 points  ?Count back from 20 0 points  ?Months in reverse 0 points  ?Repeat phrase 0 points  ?Total Score 0  ? ? ?Immunizations ?Immunization History  ?Administered Date(s) Administered  ? Influenza, High Dose Seasonal PF 09/25/2015, 10/29/2016, 11/06/2016, 12/14/2017, 12/21/2018, 10/08/2020  ? Janssen (J&J) SARS-COV-2 Vaccination 04/01/2019  ? PFIZER(Purple Top)SARS-COV-2 Vaccination 01/24/2020, 11/06/2020  ? Pneumococcal Conjugate-13 10/11/2013  ? Pneumococcal Polysaccharide-23 08/07/2010  ? Tdap 11/29/2009, 03/04/2010, 02/13/2011  ? Zoster, Live 08/07/2010  ? ? ?TDAP status: Due, Education has been provided regarding the importance of this vaccine. Advised may receive this vaccine at local pharmacy or Health Dept. Aware to provide a copy of the vaccination record if obtained from local pharmacy or Health Dept. Verbalized acceptance and understanding. ? ?Flu Vaccine status: Up to date ? ?Pneumococcal vaccine status: Up to date ? ?Covid-19 vaccine status: Completed vaccines ? ?Qualifies for Shingles Vaccine? Yes   ?Zostavax completed No   ?Shingrix Completed?: No.    Education has been provided regarding the importance of this vaccine. Patient has been advised to call insurance company to determine out of pocket expense if they have not yet received this vaccine. Advised may also receive vaccine at local pharmacy or Health Dept. Verbalized acceptance and  understanding. ? ?Screening Tests ?Health Maintenance  ?Topic Date Due  ? COVID-19 Vaccine (4 - Booster for Janssen series) 04/27/2021 (Originally 01/01/2021)  ? Zoster Vaccines- Shingrix (1 of 2) 01/30/2022 (Originally 09/06/17

## 2021-04-11 NOTE — Patient Instructions (Addendum)
?[name] , ?Thank you for taking time to come for your Medicare Wellness Visit. I appreciate your ongoing commitment to your health goals. Please review the following plan we discussed and let me know if I can assist you in the future.  ? ?These are the goals we discussed: ? Goals   ? ?   Eat better (pt-stated)   ?   I Would like to travel more. ?  ?   Increase physical activity   ? ?  ?  ?This is a list of the screening recommended for you and due dates:  ?Health Maintenance  ?Topic Date Due  ? COVID-19 Vaccine (4 - Booster for Janssen series) 04/27/2021*  ? Zoster (Shingles) Vaccine (1 of 2) 01/30/2022*  ? DEXA scan (bone density measurement)  04/12/2022*  ? Tetanus Vaccine  04/12/2022*  ? Colon Cancer Screening  04/07/2022  ? Flu Shot  Completed  ? Hepatitis C Screening: USPSTF Recommendation to screen - Ages 27-79 yo.  Completed  ? HPV Vaccine  Aged Out  ? Pneumonia Vaccine  Discontinued  ?*Topic was postponed. The date shown is not the original due date.  ? ?Advanced directives: No Patient deferred ? ?Conditions/risks identified: None ? ?Next appointment: Follow up in one year for your annual wellness visit  ? ? ?Preventive Care 55 Years and Older, Female ?Preventive care refers to lifestyle choices and visits with your health care provider that can promote health and wellness. ?What does preventive care include? ?A yearly physical exam. This is also called an annual well check. ?Dental exams once or twice a year. ?Routine eye exams. Ask your health care provider how often you should have your eyes checked. ?Personal lifestyle choices, including: ?Daily care of your teeth and gums. ?Regular physical activity. ?Eating a healthy diet. ?Avoiding tobacco and drug use. ?Limiting alcohol use. ?Practicing safe sex. ?Taking low-dose aspirin every day. ?Taking vitamin and mineral supplements as recommended by your health care provider. ?What happens during an annual well check? ?The services and screenings done by  your health care provider during your annual well check will depend on your age, overall health, lifestyle risk factors, and family history of disease. ?Counseling  ?Your health care provider may ask you questions about your: ?Alcohol use. ?Tobacco use. ?Drug use. ?Emotional well-being. ?Home and relationship well-being. ?Sexual activity. ?Eating habits. ?History of falls. ?Memory and ability to understand (cognition). ?Work and work Statistician. ?Reproductive health. ?Screening  ?You may have the following tests or measurements: ?Height, weight, and BMI. ?Blood pressure. ?Lipid and cholesterol levels. These may be checked every 5 years, or more frequently if you are over 55 years old. ?Skin check. ?Lung cancer screening. You may have this screening every year starting at age 5 if you have a 30-pack-year history of smoking and currently smoke or have quit within the past 15 years. ?Fecal occult blood test (FOBT) of the stool. You may have this test every year starting at age 47. ?Flexible sigmoidoscopy or colonoscopy. You may have a sigmoidoscopy every 5 years or a colonoscopy every 10 years starting at age 42. ?Hepatitis C blood test. ?Hepatitis B blood test. ?Sexually transmitted disease (STD) testing. ?Diabetes screening. This is done by checking your blood sugar (glucose) after you have not eaten for a while (fasting). You may have this done every 1-3 years. ?Bone density scan. This is done to screen for osteoporosis. You may have this done starting at age 76. ?Mammogram. This may be done every 1-2 years. Talk to  your health care provider about how often you should have regular mammograms. ?Talk with your health care provider about your test results, treatment options, and if necessary, the need for more tests. ?Vaccines  ?Your health care provider may recommend certain vaccines, such as: ?Influenza vaccine. This is recommended every year. ?Tetanus, diphtheria, and acellular pertussis (Tdap, Td) vaccine. You  may need a Td booster every 10 years. ?Zoster vaccine. You may need this after age 79. ?Pneumococcal 13-valent conjugate (PCV13) vaccine. One dose is recommended after age 40. ?Pneumococcal polysaccharide (PPSV23) vaccine. One dose is recommended after age 35. ?Talk to your health care provider about which screenings and vaccines you need and how often you need them. ?This information is not intended to replace advice given to you by your health care provider. Make sure you discuss any questions you have with your health care provider. ?Document Released: 02/08/2015 Document Revised: 10/02/2015 Document Reviewed: 11/13/2014 ?Elsevier Interactive Patient Education ? 2017 [employer]. ? ?Fall Prevention in the Home ?Falls can cause injuries. They can happen to people of all ages. There are many things you can do to make your home safe and to help prevent falls. ?What can I do on the outside of my home? ?Regularly fix the edges of walkways and driveways and fix any cracks. ?Remove anything that might make you trip as you walk through a door, such as a raised step or threshold. ?Trim any bushes or trees on the path to your home. ?Use bright outdoor lighting. ?Clear any walking paths of anything that might make someone trip, such as rocks or tools. ?Regularly check to see if handrails are loose or broken. Make sure that both sides of any steps have handrails. ?Any raised decks and porches should have guardrails on the edges. ?Have any leaves, snow, or ice cleared regularly. ?Use sand or salt on walking paths during winter. ?Clean up any spills in your garage right away. This includes oil or grease spills. ?What can I do in the bathroom? ?Use night lights. ?Install grab bars by the toilet and in the tub and shower. Do not use towel bars as grab bars. ?Use non-skid mats or decals in the tub or shower. ?If you need to sit down in the shower, use a plastic, non-slip stool. ?Keep the floor dry. Clean up any water that spills  on the floor as soon as it happens. ?Remove soap buildup in the tub or shower regularly. ?Attach bath mats securely with double-sided non-slip rug tape. ?Do not have throw rugs and other things on the floor that can make you trip. ?What can I do in the bedroom? ?Use night lights. ?Make sure that you have a light by your bed that is easy to reach. ?Do not use any sheets or blankets that are too big for your bed. They should not hang down onto the floor. ?Have a firm chair that has side arms. You can use this for support while you get dressed. ?Do not have throw rugs and other things on the floor that can make you trip. ?What can I do in the kitchen? ?Clean up any spills right away. ?Avoid walking on wet floors. ?Keep items that you use a lot in easy-to-reach places. ?If you need to reach something above you, use a strong step stool that has a grab bar. ?Keep electrical cords out of the way. ?Do not use floor polish or wax that makes floors slippery. If you must use wax, use non-skid floor wax. ?Do not  have throw rugs and other things on the floor that can make you trip. ?What can I do with my stairs? ?Do not leave any items on the stairs. ?Make sure that there are handrails on both sides of the stairs and use them. Fix handrails that are broken or loose. Make sure that handrails are as long as the stairways. ?Check any carpeting to make sure that it is firmly attached to the stairs. Fix any carpet that is loose or worn. ?Avoid having throw rugs at the top or bottom of the stairs. If you do have throw rugs, attach them to the floor with carpet tape. ?Make sure that you have a light switch at the top of the stairs and the bottom of the stairs. If you do not have them, ask someone to add them for you. ?What else can I do to help prevent falls? ?Wear shoes that: ?Do not have high heels. ?Have rubber bottoms. ?Are comfortable and fit you well. ?Are closed at the toe. Do not wear sandals. ?If you use a stepladder: ?Make  sure that it is fully opened. Do not climb a closed stepladder. ?Make sure that both sides of the stepladder are locked into place. ?Ask someone to hold it for you, if possible. ?Clearly mark and make sur

## 2021-04-11 NOTE — Addendum Note (Signed)
Addended by: Criselda Peaches on: 04/11/2021 11:07 AM ? ? Modules accepted: Orders ? ?

## 2021-05-03 IMAGING — CR DG CHEST 2V
2 series · 2 of 2 positions shown · non-contrast
Comparison: PA and lateral chest 07/26/2018 and 08/01/2015.

CLINICAL DATA: Left chest pain and cough for 1 day.

EXAM:
CHEST - 2 VIEW

[w chest pa]
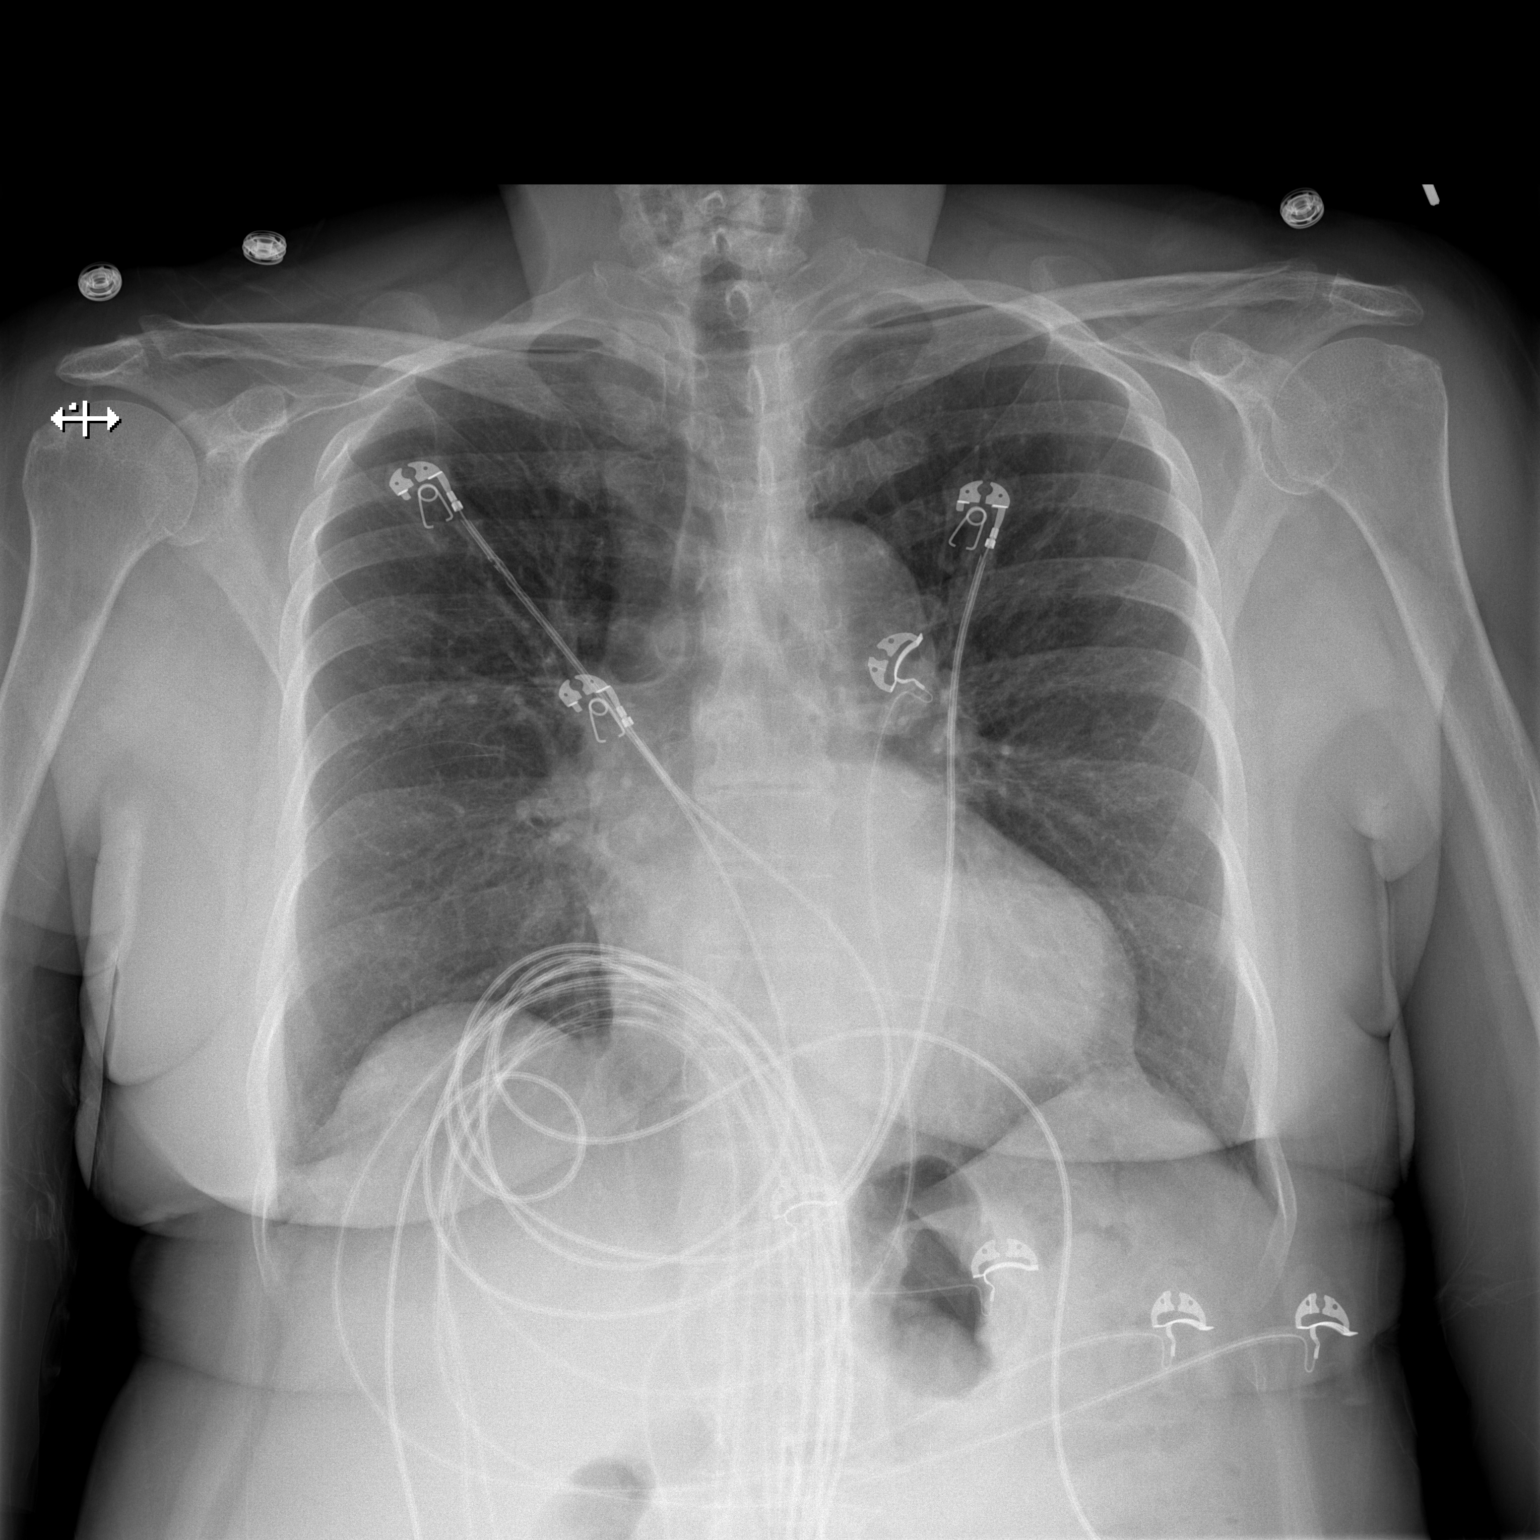

[w chest lat]
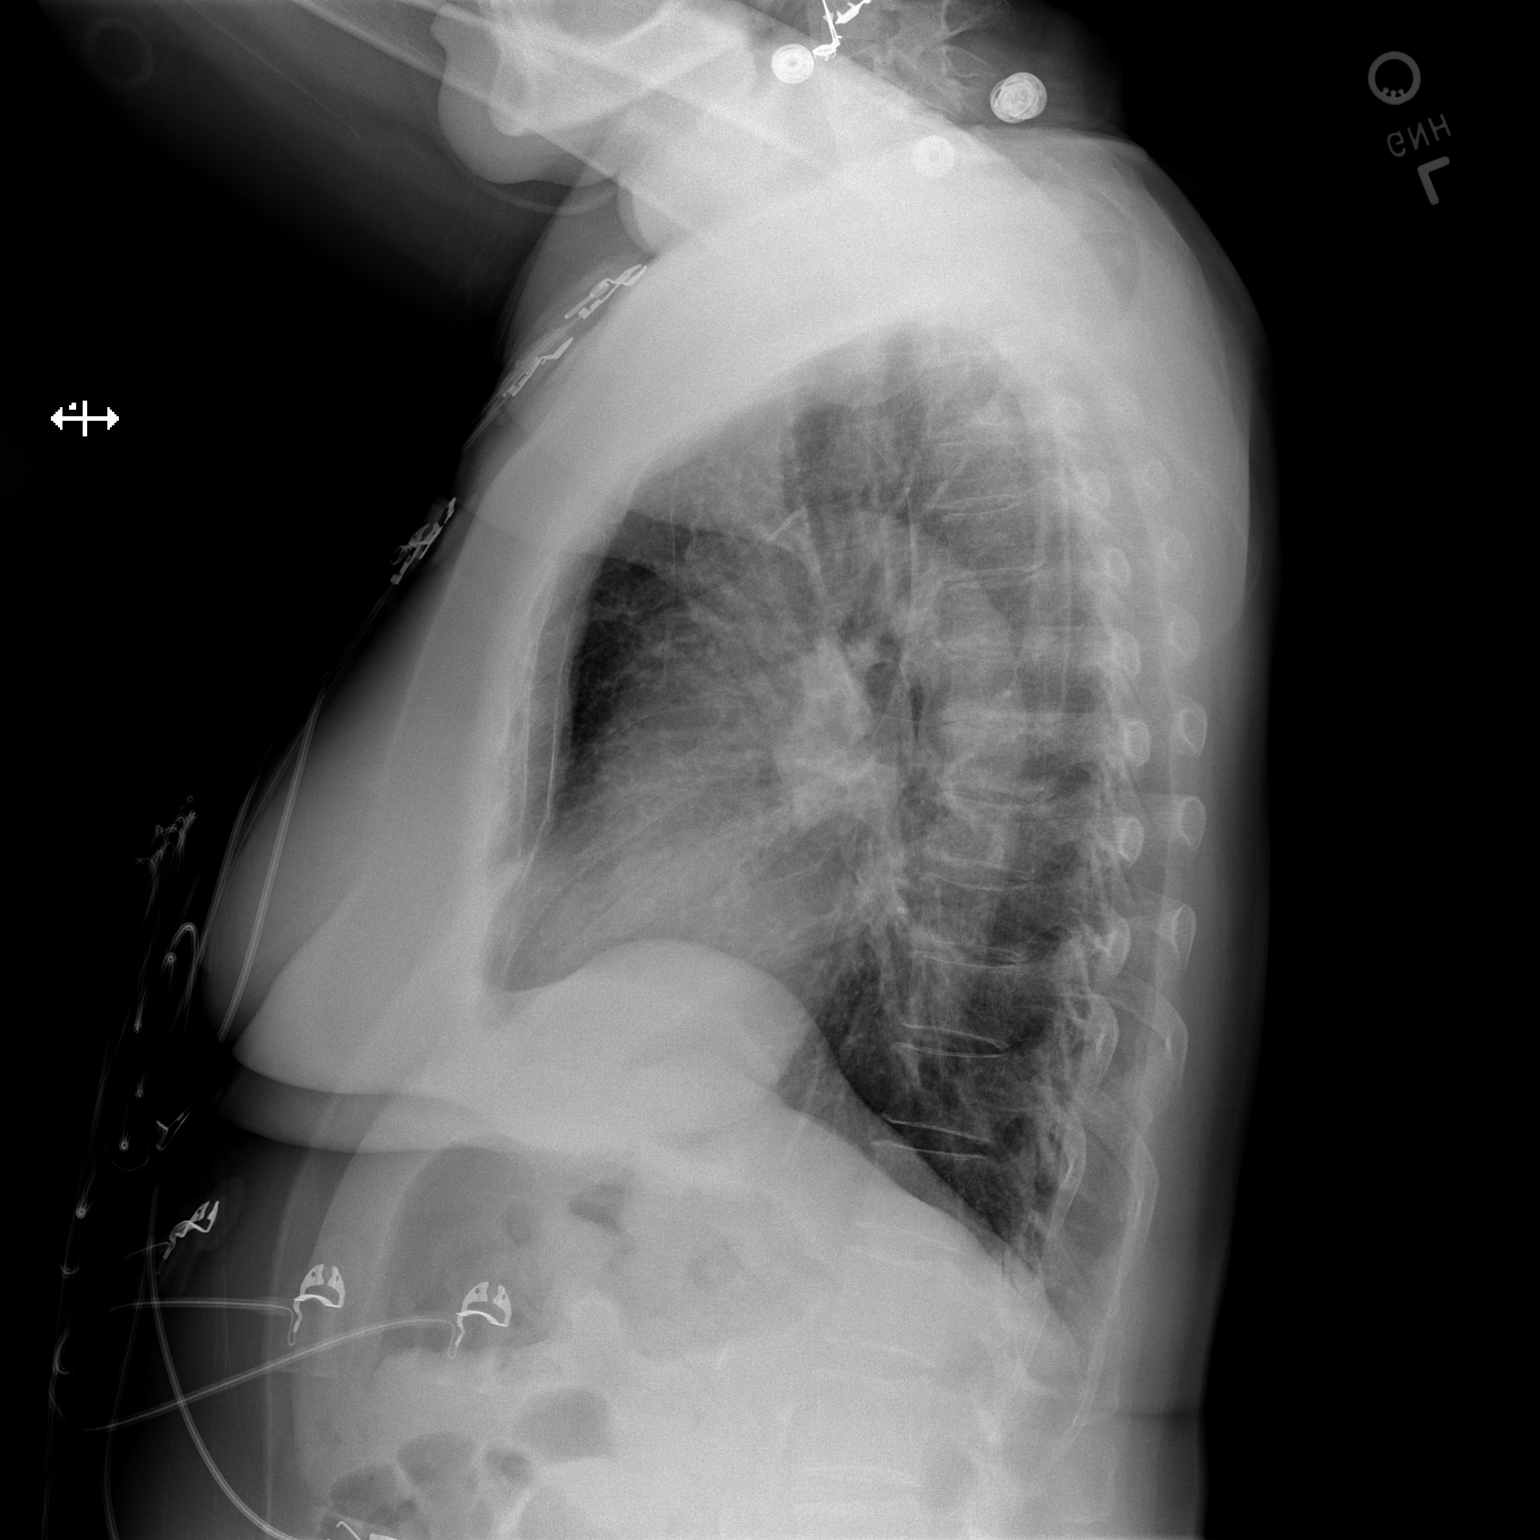

[2 of 2 positions shown; findings below may reference images not displayed]

FINDINGS: The lungs are clear. Heart size is normal. No pneumothorax or
pleural fluid. No acute or focal bony abnormality.
IMPRESSION: Negative chest.

## 2021-06-17 NOTE — Progress Notes (Signed)
ACUTE VISIT Chief Complaint  Patient presents with   Cough   HPI: [name] is a 76 y.o. female, who is here today complaining of cough. 2 weeks ago she started non productive cough and sneezing, which she was in [place]. Sunday she started with productive cough, intermittent wheezing, subjective fever, and body aches. Sore throat exacerbated by cough.  Cough This is a new problem. The current episode started in the past 7 days. The problem has been unchanged. The cough is Productive of brown sputum. Associated symptoms include chills, ear congestion, ear pain, a fever, headaches, myalgias, postnasal drip, rhinorrhea, a sore throat and wheezing. Pertinent negatives include no chest pain, eye redness, heartburn, hemoptysis, nasal congestion, rash, shortness of breath, sweats or weight loss. Nothing aggravates the symptoms. She has tried OTC cough suppressant for the symptoms. There is no history of environmental allergies.  Fronto-occipital pressure headache. " Little" bilateral earache. No changes in chronic tinnitus.  Taking Tylenol, Nyquil,and Dayquil.  Came back from She came back from [place] Sunday. No known sick contact.  Review of Systems  Constitutional:  Positive for chills and fever. Negative for weight loss.  HENT:  Positive for ear pain, postnasal drip, rhinorrhea and sore throat. Negative for ear discharge.   Eyes:  Negative for discharge and redness.  Respiratory:  Positive for cough and wheezing. Negative for hemoptysis and shortness of breath.   Cardiovascular:  Negative for chest pain.  Gastrointestinal:  Negative for heartburn.  Genitourinary:  Negative for decreased urine volume, dysuria and hematuria.  Musculoskeletal:  Positive for myalgias.  Skin:  Negative for rash.  Allergic/Immunologic: Negative for environmental allergies.  Neurological:  Positive for headaches. Negative for syncope.  Rest see pertinent positives and negatives per  HPI.  Current Outpatient Medications on File Prior to Visit  Medication Sig Dispense Refill   aspirin EC 81 MG tablet Take 81 mg by mouth daily.     meloxicam (MOBIC) 7.5 MG tablet Take 1 tablet (7.5 mg total) by mouth daily as needed for pain. 30 tablet 0   metoprolol succinate (TOPROL-XL) 25 MG 24 hr tablet Take 1 tablet (25 mg total) by mouth daily. 90 tablet 2   omeprazole (PRILOSEC) 40 MG capsule Take 40 mg by mouth daily.     rosuvastatin (CRESTOR) 10 MG tablet Take 1 tablet (10 mg total) by mouth daily. 90 tablet 2   triamterene-hydrochlorothiazide (MAXZIDE-25) 37.5-25 MG tablet Take 1 tablet by mouth daily. 90 tablet 2   No current facility-administered medications on file prior to visit.   Past Medical History:  Diagnosis Date   Cataract    DCIS (ductal carcinoma in situ) of breast    Diverticular disease    Hyperlipidemia    Hypertension    Mitral valve prolapse    Allergies  Allergen Reactions   Losartan Swelling   Ibuprofen Nausea And Vomiting   Tape Rash    Social History   Socioeconomic History   Marital status: Married    Spouse name: Not on file   Number of children: Not on file   Years of education: Not on file   Highest education level: Not on file  Occupational History   Not on file  Tobacco Use   Smoking status: Never   Smokeless tobacco: Never  Substance and Sexual Activity   Alcohol use: Yes    Alcohol/week: 0.0 - 1.0 standard drinks   Drug use: No   Sexual activity: Yes    Birth control/protection:  Surgical  Other Topics Concern   Not on file  Social History Narrative   Not on file   Social Determinants of Health   Financial Resource Strain: Low Risk    Difficulty of Paying Living Expenses: Not hard at all  Food Insecurity: No Food Insecurity   Worried About [place] in the Last Year: Never true   [place] in the Last Year: Never true  Transportation Needs: No Transportation Needs   Lack of Transportation (Medical):  No   Lack of Transportation (Non-Medical): No  Physical Activity: Sufficiently Active   Days of Exercise per Week: 3 days   Minutes of Exercise per Session: 60 min  Stress: No Stress Concern Present   Feeling of Stress : Not at all  Social Connections: Socially Integrated   Frequency of Communication with Friends and Family: More than three times a week   Frequency of Social Gatherings with Friends and Family: More than three times a week   Attends Religious Services: More than 4 times per year   Active Member of Clubs or Organizations: Yes   Attends Archivist Meetings: More than 4 times per year   Marital Status: Married   Vitals:   06/18/21 0817  BP: 118/80  Pulse: 70  Resp: 16  Temp: 99.2 F (37.3 C)  SpO2: 93%   Body mass index is 32.08 kg/m.  Physical Exam Vitals and nursing note reviewed.  Constitutional:      General: She is not in acute distress.    Appearance: She is well-developed. She is not ill-appearing.  HENT:     Head: Atraumatic.     Right Ear: Tympanic membrane, ear canal and external ear normal.     Left Ear: Tympanic membrane, ear canal and external ear normal.     Nose: Rhinorrhea present. No congestion.     Right Turbinates: Not enlarged.     Left Turbinates: Not enlarged.     Right Sinus: No maxillary sinus tenderness or frontal sinus tenderness.     Left Sinus: No maxillary sinus tenderness or frontal sinus tenderness.     Mouth/Throat:     Pharynx: Uvula midline. Posterior oropharyngeal erythema (Mild.) present. No oropharyngeal exudate or uvula swelling.     Comments: +Post nasal drainage. Eyes:     Conjunctiva/sclera: Conjunctivae normal.  Cardiovascular:     Rate and Rhythm: Normal rate and regular rhythm.     Heart sounds: No murmur heard. Pulmonary:     Effort: Pulmonary effort is normal. No respiratory distress.     Breath sounds: Normal breath sounds. No stridor.  Lymphadenopathy:     Head:     Right side of head: No  submandibular adenopathy.     Left side of head: No submandibular adenopathy.     Cervical: No cervical adenopathy.  Skin:    General: Skin is warm.     Findings: No erythema or rash.  Neurological:     Mental Status: She is alert and oriented to person, place, and time.  Psychiatric:     Comments: Well groomed, good eye contact.   ASSESSMENT AND PLAN:  Ms.Jenai was seen today for cough.  Diagnoses and all orders for this visit: Orders Placed This Encounter  Procedures   DG Chest 2 View   POC COVID-19   URI, acute Symptoms suggests a viral etiology, I explained patient that symptomatic treatment is usually recommended in this case. Rapid COVID 19 test negative. Instructed to  monitor for new symptoms or signs of complications, check temp. Adequate hydration. Continue Tylenol 500 mg 3-4 tabs per day prn. F/U as needed.  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 I do not think systemic steroid treatment is needed at this time but instructed to let me know if wheezing is recurrent. Albuterol inh 2 puff every 6 hours for a week then as needed for wheezing or shortness of breath.  Instructed about warning signs.  -     albuterol (VENTOLIN HFA) 108 (90 Base) MCG/ACT inhaler; Inhale 2 puffs into the lungs every 6 (six) hours as needed for wheezing or shortness of breath.  Cough, unspecified type I also explained that cough and nasal congestion can last a few days and sometimes weeks. Plain mucinex may help. CXR ordered today.  Return if symptoms worsen or fail to improve.  Tess Potts G. [place], MD  [facility]. [place] office.

## 2021-06-18 ENCOUNTER — Ambulatory Visit (INDEPENDENT_AMBULATORY_CARE_PROVIDER_SITE_OTHER): Payer: Medicare Other

## 2021-06-18 ENCOUNTER — Encounter: Payer: Self-pay | Admitting: Family Medicine

## 2021-06-18 ENCOUNTER — Ambulatory Visit (INDEPENDENT_AMBULATORY_CARE_PROVIDER_SITE_OTHER): Payer: Medicare Other | Admitting: Family Medicine

## 2021-06-18 VITALS — BP 118/80 | HR 70 | Temp 99.2°F | Resp 16 | Ht <= 58 in | Wt 153.5 lb

## 2021-06-18 DIAGNOSIS — J989 Respiratory disorder, unspecified: Secondary | ICD-10-CM

## 2021-06-18 DIAGNOSIS — J069 Acute upper respiratory infection, unspecified: Secondary | ICD-10-CM

## 2021-06-18 DIAGNOSIS — R062 Wheezing: Secondary | ICD-10-CM | POA: Diagnosis not present

## 2021-06-18 DIAGNOSIS — R059 Cough, unspecified: Secondary | ICD-10-CM

## 2021-06-18 LAB — POC COVID19 BINAXNOW: SARS Coronavirus 2 Ag: NEGATIVE

## 2021-06-18 MED ORDER — ALBUTEROL SULFATE HFA 108 (90 BASE) MCG/ACT IN AERS: 2.0000 | INHALATION_SPRAY | Freq: Four times a day (QID) | RESPIRATORY_TRACT | 0 refills | Status: DC | PRN

## 2021-06-18 NOTE — Patient Instructions (Addendum)
A few things to remember from today's visit:  URI, acute - Plan: POC COVID-19  Reactive airway disease without asthma - Plan: albuterol (VENTOLIN HFA) 108 (90 Base) MCG/ACT inhaler  Cough, unspecified type - Plan: DG Chest 2 View  If you need refills please call your pharmacy. Do not use My Chart to request refills or for acute issues that need immediate attention.   Albuterol inh 2 puff every 6 hours for a week then as needed for wheezing or shortness of breath.   Please be sure medication list is accurate. If a new problem present, please set up appointment sooner than planned today. Infeccin de las vas respiratorias superiores en adultos Upper Respiratory Infection, Adult Una infeccin de las vas respiratorias superiores (IVRS) afecta la nariz, la garganta y las vas respiratorias superiores que llegan a los pulmones. El tipo ms comn de IVRS suele conocerse como el resfro comn. Las IVRS generalmente mejoran solas, sin tratamiento mdico. Cules son las causas? La causa de las IVRS es un germen (virus). Puede contraer estos grmenes de las siguientes maneras: Al aspirar las gotitas que una persona infectada elimina al toser o Brewing technologist. Al tocar algo que tiene el germen (est contaminado) y luego tocarse la boca, la nariz o los ojos. Qu incrementa el riesgo? Es ms propenso a Armed forces technical officer IVRS si: Es muy pequeo o de edad muy [place]. Tiene contacto cercano con otros, como en el [place], la escuela o un centro de atencin mdica. Fuma. Tiene una enfermedad cardaca o pulmonar a largo plazo (crnica). Tiene debilitado el sistema encargado de combatir las enfermedades (sistema inmunitario). Tiene asma o alergias nasales. Tiene mucho estrs. Tiene un dficit nutricional. Cules son los signos o sntomas? Secrecin nasal o nariz tapada (congestin). Tos. Estornudos. Dolor de Investment banker, operational. Dolor de [place]. Sensacin de cansancio (fatiga). Cristy Hilts. No querer comer tanto  como lo hace habitualmente. Dolor en la frente, detrs de los ojos y por encima de los pmulos (dolor sinusal). Dolores musculares. Enrojecimiento o irritacin de los ojos. Presin en los odos o la cara. Cmo se trata? Las IVRS generalmente mejoran por s solas en un perodo de entre 7 y [place]. Los medicamentos no curan las IVRS, Armed forces training and education officer el mdico puede recomendarle ciertos medicamentos para ayudar a [employer], como por ejemplo: Medicamentos para la tos de [place]. Medicamentos para reducir la tos (antitusivos). La tos es un tipo de defensa contra las infecciones que ayuda a Chiropodist la nariz, la garganta, la trquea y los pulmones (el sistema respiratorio). Tome estos medicamentos solamente como se lo haya indicado el mdico. Medicamentos para bajar la [place]. Siga estas instrucciones en su casa: Actividad Descanse todo lo que sea necesario. Si tiene fiebre, [employer], sin ir al [place] o a la escuela, hasta que ya no tenga fiebre, o hasta que el mdico le indique que puede regresar al [place] o a la escuela. Debe permanecer en su casa hasta que ya no pueda propagar (contagiar) la infeccin. Es posible que el mdico le indique que use una mascarilla para tener menos riesgo de propagar la infeccin. Para aliviar los sntomas Enjuguese la boca frecuentemente con una mezcla de agua con sal. Para preparar agua con sal, disuelva de  a 1 cucharadita (de 3 a 6 g) de sal en 1 taza (237 ml) de agua tibia. Use un humidificador de aire fro para agregar humedad al aire. Esto puede ayudarlo a que respire mejor. Comida y bebida  Beba suficiente lquido para Theatre manager  la orina de color amarillo plido. Tome sopas y caldos transparentes. Instrucciones generales  Use los medicamentos de venta libre y los recetados solamente como se lo haya indicado el mdico. No fume ni consuma ningn producto que contenga nicotina o tabaco. Si necesita ayuda para dejar de fumar, consulte al  mdico. Evite estar cerca de personas que fuman (evite el humo ambiental de tabaco). Mantngase al da con todas las vacunas (inmunizaciones) y aplquese la vacuna contra la gripe todos los [place]. Concurra a [place]. Cmo evitar contagiar la infeccin a otros  [employer] con agua y jabn durante al menos 20 segundos. Use un desinfectante para manos si no dispone de [place] y [place]. Evite tocarse la boca, la cara, los ojos o la [place]. Tosa o estornude en un pauelo de papel o sobre su manga o codo. No tosa o estornude al aire ni se cubra la boca o la nariz con la [place]. Comunquese con un mdico si: Siente que empeora o que no mejora. Tiene alguno de estos sntomas: Cristy Hilts o escalofros. Mucosidad color marrn o roja en la nariz. Lquido amarillento o amarronado (Psychiatric nurse de la Lawyer. Dolor en la cara, especialmente al inclinarse hacia adelante. Ganglios del cuello inflamados. Dolor al tragar. Zonas blancas en la parte de atrs de la garganta. Solicite ayuda de inmediato si: La falta de aire empeora. Los siguientes sntomas son muy intensos o constantes: Dolor de [place]. Dolor de odo. Dolor en la frente, detrs de los ojos y por encima de los pmulos (dolor sinusal). Dolor de pecho. Tiene una enfermedad pulmonar prolongada (crnica) junto con cualquiera de estos sntomas: Emitir sonidos de silbidos agudos al respirar, ms a menudo al exhalar (sibilancias). Tos prolongada (ms de [address]). Tos con sangre. Cambio en la mucosidad habitual. Tiene rigidez en el cuello. Tiene cambios en: La visin. La audicin. El razonamiento. El [place] de nimo. Estos sntomas pueden Sales executive. Solicite ayuda de inmediato. Llame al 911. No espere a ver si los sntomas desaparecen. No conduzca por sus propios medios [employer] hospital. Resumen Una infeccin de las vas respiratorias superiores (IVRS) es causada por un germen (virus). El tipo ms  comn de IVRS suele conocerse como el resfro comn. Una IVRS suele mejorar en el transcurso de 7 a 10 das. Use los medicamentos de venta libre y los recetados solamente como se lo haya indicado el mdico. Esta informacin no tiene Marine scientist el consejo del mdico. Asegrese de hacerle al mdico cualquier pregunta que tenga. Document Revised: 09/09/2020 Document Reviewed: 09/09/2020 Elsevier Patient Education  [place].

## 2021-07-01 DIAGNOSIS — Z961 Presence of intraocular lens: Secondary | ICD-10-CM | POA: Diagnosis not present

## 2021-07-01 DIAGNOSIS — H04123 Dry eye syndrome of bilateral lacrimal glands: Secondary | ICD-10-CM | POA: Diagnosis not present

## 2021-07-01 DIAGNOSIS — H40023 Open angle with borderline findings, high risk, bilateral: Secondary | ICD-10-CM | POA: Diagnosis not present

## 2021-09-16 ENCOUNTER — Ambulatory Visit: Payer: Medicare Other | Admitting: Family Medicine

## 2021-09-30 NOTE — Progress Notes (Signed)
HPI: [name] is a 76 y.o. female, who is here today to follow on recent visit.  She was last seen on 02/11/21. Last visit Meloxicam 7.5 mg to take daily as needed was recommended. She is not sure if it helped with IP joint pain. She has been using topical Voltaren gel and it seems to help.  Hypertension:  Medications:Triamterene-HCTZ 37.5-25 mg daily and Metoprolol succinate 25 mg daily. BP readings at home:not checking. Negative for unusual or severe headache, visual changes, exertional chest pain, dyspnea,  focal weakness, or edema.  Lab Results  Component Value Date   CREATININE 0.75 02/11/2021   BUN 17 02/11/2021   NA 139 02/11/2021   K 3.7 02/11/2021   CL 100 02/11/2021   CO2 30 02/11/2021   Last HgA1C in 9.2022 was 5.9. Negative for polydipsia,polyuria, or polyphagia.  Aortic atherosclerosis seen on imaging. Stopped Aspirin 81 mg when she was dx'ed with COVID 19 infection. No signs of bleeding.  She is on Crestor 10 mg daily.  Lab Results  Component Value Date   CHOL 163 02/11/2021   HDL 70.00 02/11/2021   LDLCALC 69 02/11/2021   TRIG 117.0 02/11/2021   CHOLHDL 2 02/11/2021   Fell while she was in [place], 09/11/21. She was going down hill, lost balance and fell, landed on hands. No head trauma. Evaluated in the ED and reporting negative X rays for fractures, no residual symptoms. Completed PT.  Review of Systems  Constitutional:  Negative for activity change, appetite change and fever.  HENT:  Negative for mouth sores, nosebleeds and sore throat.   Respiratory:  Negative for cough and wheezing.   Gastrointestinal:  Negative for abdominal pain, nausea and vomiting.       Negative for changes in bowel habits.  Genitourinary:  Negative for decreased urine volume and hematuria.  Musculoskeletal:  Positive for arthralgias. Negative for gait problem and joint swelling.  Neurological:  Negative for syncope, facial asymmetry,  weakness and numbness.  Rest see pertinent positives and negatives per HPI.  Current Outpatient Medications on File Prior to Visit  Medication Sig Dispense Refill   aspirin EC 81 MG tablet Take 81 mg by mouth daily.     metoprolol succinate (TOPROL-XL) 25 MG 24 hr tablet Take 1 tablet (25 mg total) by mouth daily. 90 tablet 2   omeprazole (PRILOSEC) 40 MG capsule Take 40 mg by mouth daily.     rosuvastatin (CRESTOR) 10 MG tablet Take 1 tablet (10 mg total) by mouth daily. 90 tablet 2   triamterene-hydrochlorothiazide (MAXZIDE-25) 37.5-25 MG tablet Take 1 tablet by mouth daily. 90 tablet 2   No current facility-administered medications on file prior to visit.    Past Medical History:  Diagnosis Date   Cataract    DCIS (ductal carcinoma in situ) of breast    Diverticular disease    Hyperlipidemia    Hypertension    Mitral valve prolapse    Allergies  Allergen Reactions   Losartan Swelling   Ibuprofen Nausea And Vomiting   Tape Rash    Social History   Socioeconomic History   Marital status: Married    Spouse name: Not on file   Number of children: Not on file   Years of education: Not on file   Highest education level: Not on file  Occupational History   Not on file  Tobacco Use   Smoking status: Never   Smokeless tobacco: Never  Substance and Sexual Activity   Alcohol use:  Yes    Alcohol/week: 0.0 - 1.0 standard drinks of alcohol   Drug use: No   Sexual activity: Yes    Birth control/protection: Surgical  Other Topics Concern   Not on file  Social History Narrative   Not on file   Social Determinants of Health   Financial Resource Strain: Low Risk  (04/11/2021)   Overall Financial Resource Strain (CARDIA)    Difficulty of Paying Living Expenses: Not hard at all  Food Insecurity: No Food Insecurity (04/11/2021)   Hunger Vital Sign    Worried About Running Out of Food in the Last Year: Never true    Ran Out of Food in the Last Year: Never true  Transportation  Needs: No Transportation Needs (04/11/2021)   PRAPARE - Hydrologist (Medical): No    Lack of Transportation (Non-Medical): No  Physical Activity: Sufficiently Active (04/11/2021)   Exercise Vital Sign    Days of Exercise per Week: 3 days    Minutes of Exercise per Session: 60 min  Stress: No Stress Concern Present (04/11/2021)   [place]    Feeling of Stress : Not at all  Social Connections: [place] (04/11/2021)   Social Connection and Isolation Panel [NHANES]    Frequency of Communication with Friends and Family: More than three times a week    Frequency of Social Gatherings with Friends and Family: More than three times a week    Attends Religious Services: More than 4 times per year    Active Member of [employer] or Organizations: Yes    Attends Archivist Meetings: More than 4 times per year    Marital Status: Married   Vitals:   10/01/21 0908  BP: 118/60  Pulse: 70  Resp: 16  Temp: 98.4 F (36.9 C)  SpO2: 97%   Body mass index is 33.05 kg/m.  Physical Exam Vitals and nursing note reviewed.  Constitutional:      General: She is not in acute distress.    Appearance: She is well-developed.  HENT:     Head: Normocephalic and atraumatic.     Mouth/Throat:     Mouth: Mucous membranes are moist.     Dentition: Has dentures.     Pharynx: Oropharynx is clear.  Eyes:     Conjunctiva/sclera: Conjunctivae normal.  Cardiovascular:     Rate and Rhythm: Normal rate and regular rhythm.     Pulses:          Dorsalis pedis pulses are 2+ on the right side and 2+ on the left side.     Heart sounds: No murmur heard. Pulmonary:     Effort: Pulmonary effort is normal. No respiratory distress.     Breath sounds: Normal breath sounds.  Abdominal:     Palpations: Abdomen is soft. There is no mass.     Tenderness: There is no abdominal tenderness.  Musculoskeletal:      Right hand: No swelling. Normal pulse.     Left hand: No swelling. Normal pulse.     Right lower leg: No edema.     Left lower leg: No edema.     Comments: No signs of synovitis.  Skin:    General: Skin is warm.     Findings: No erythema or rash.  Neurological:     General: No focal deficit present.     Mental Status: She is alert and oriented to person, place, and time.  Cranial Nerves: No cranial nerve deficit.     Gait: Gait normal.  Psychiatric:     Comments: Well groomed, good eye contact.   ASSESSMENT AND PLAN:  [name] was seen today for follow-up.  Diagnoses and all orders for this visit: Orders Placed This Encounter  Procedures   Basic metabolic panel   Hemoglobin A1c   Lab Results  Component Value Date   HGBA1C 6.2 10/01/2021   Lab Results  Component Value Date   CREATININE 0.66 10/01/2021   BUN 14 10/01/2021   NA 137 10/01/2021   K 3.2 (L) 10/01/2021   CL 99 10/01/2021   CO2 29 10/01/2021   Fall, subsequent encounter Fall precautions discussed. Completed PT in [place].  Prediabetes Continue a healthy life style for diabetes prevention. Further recommendations according to HgA1C result.  Hypertension, essential, benign BP adequately controlled. Continue current management: Triamterene-HCTZ 37.5-25 mg daily, some side effects discussed. DASH/low salt diet to continue. Monitor BP at home. Eye exam current.   Atherosclerosis of aorta ([place]) Seen on abdominal/pelvic CT in 06/2017. We discussed some side effects of Aspirin, recommend resuming 81 mg daily. Continue Crestor 10 mg daily.  Arthralgia of both hands Problem has improved. Voltaren gel is helping ,so continue qid prn.  Return in about 6 months (around 04/01/2022) for cpe and f/u.  Maily Debarge G. [place], MD  [facility]. [place] office.

## 2021-10-01 ENCOUNTER — Ambulatory Visit (INDEPENDENT_AMBULATORY_CARE_PROVIDER_SITE_OTHER): Payer: Medicare Other | Admitting: Family Medicine

## 2021-10-01 ENCOUNTER — Encounter: Payer: Self-pay | Admitting: Family Medicine

## 2021-10-01 VITALS — BP 118/60 | HR 70 | Temp 98.4°F | Resp 16 | Ht <= 58 in | Wt 147.4 lb

## 2021-10-01 DIAGNOSIS — I700 Atherosclerosis of aorta: Secondary | ICD-10-CM | POA: Diagnosis not present

## 2021-10-01 DIAGNOSIS — W19XXXD Unspecified fall, subsequent encounter: Secondary | ICD-10-CM | POA: Diagnosis not present

## 2021-10-01 DIAGNOSIS — M25541 Pain in joints of right hand: Secondary | ICD-10-CM | POA: Diagnosis not present

## 2021-10-01 DIAGNOSIS — I10 Essential (primary) hypertension: Secondary | ICD-10-CM

## 2021-10-01 DIAGNOSIS — E876 Hypokalemia: Secondary | ICD-10-CM | POA: Diagnosis not present

## 2021-10-01 DIAGNOSIS — R7303 Prediabetes: Secondary | ICD-10-CM

## 2021-10-01 DIAGNOSIS — M25542 Pain in joints of left hand: Secondary | ICD-10-CM

## 2021-10-01 LAB — BASIC METABOLIC PANEL
BUN: 14 mg/dL (ref 6–23)
CO2: 29 mEq/L (ref 19–32)
Calcium: 9.2 mg/dL (ref 8.4–10.5)
Chloride: 99 mEq/L (ref 96–112)
Creatinine, Ser: 0.66 mg/dL (ref 0.40–1.20)
GFR: 85.42 mL/min (ref >60.00)
Glucose, Bld: 88 mg/dL (ref 70–99)
Potassium: 3.2 mEq/L — ABNORMAL LOW (ref 3.5–5.1)
Sodium: 137 mEq/L (ref 135–145)

## 2021-10-01 LAB — HEMOGLOBIN A1C: Hgb A1c MFr Bld: 6.2 % (ref 4.6–6.5)

## 2021-10-01 NOTE — Assessment & Plan Note (Signed)
Problem has improved. Voltaren gel is helping ,so continue qid prn.

## 2021-10-01 NOTE — Assessment & Plan Note (Signed)
Seen on abdominal/pelvic CT in 06/2017. We discussed some side effects of Aspirin, recommend resuming 81 mg daily. Continue Crestor 10 mg daily.

## 2021-10-01 NOTE — Patient Instructions (Signed)
A few things to remember from today's visit:  Atherosclerosis of aorta ([place])  Hypertension, essential, benign - Plan: Basic metabolic panel  Prediabetes - Plan: Hemoglobin A1c  If you need refills please call your pharmacy. Do not use My Chart to request refills or for acute issues that need immediate attention.   Continue Crestor y empieze Aspirina otra vez. Voltaren gel para la artritis. El resto lo mismo.  Please be sure medication list is accurate. If a new problem present, please set up appointment sooner than planned today.  Prevencin de cadas en Engineer, mining, en adultos Fall Prevention in the Home, Adult Las cadas pueden causar lesiones y pueden ocurrirles a personas de todas las edades. Hay muchas cosas que puede hacer para que su casa sea un lugar seguro y para ayudar a prevenir las cadas. Pida ayuda cuando haga estos cambios. Qu medidas puedo tomar para prevenir cadas? Indicaciones generales Use una buena iluminacin en todos los [place]. Reemplace las bombillas que se hayan quemado. Encienda las luces en zonas oscuras. Use luces nocturnas. Mantenga los objetos que [place] con frecuencia en lugares de fcil acceso. Baje los estantes de toda la casa de ser necesario. Organice los muebles de modo de que haya espacio para caminar a su alrededor. Evite cambiar los [employer] de Environmental consultant. No tenga alfombras ni otros objetos en el piso que puedan hacerlo tropezar. Evite caminar sobre pisos mojados. Si los pisos estn desparejos, reprelos. Agregue pintura o cinta de color o contraste para Scientist, clinical (histocompatibility and immunogenetics) con claridad y poder ver: Las barras para sostn o los pasamanos. El Psychologist, educational y el ltimo escaln de las escaleras. Dnde est el borde de cada escaln. Si [place] una escalera de mano: Asegrese de que est abierta por completo. No suba a una escalera de mano cerrada. Asegrese de que ambos lados de la escalera de mano estn asegurados en su lugar. Pdale a alguien que Comptroller de  mano mientras usted la [place]. Sepa dnde estn sus mascotas cuando se desplace por su casa. Qu puedo hacer en el bao?     Mantenga el piso seco. Limpie de inmediato el agua del piso. Elimine la acumulacin de jabn en la baera o la ducha. Utilice alfombras o pegatinas antideslizantes en el piso de la baera o ducha. Asegure las alfombras del bao con una cinta antideslizante doble faz para alfombras. Si necesita sentarse cuando se ducha, use un banco plstico antideslizante. Instale barras para sostn al lado del inodoro, en la baera y en la ducha. No use los toalleros como barras de apoyo. Qu puedo hacer en el dormitorio? Asegrese de tener una luz junto a la cama que sea fcil de Science writer. No use sbanas ni mantas que sean demasiado grandes para la cama y caigan al piso. Tenga una silla firme con brazos laterales que pueda usar como apoyo cuando se vista. Qu puedo hacer en la cocina? Limpie de inmediato cualquier derrame. Si necesita alcanzar algo que est [employer], use un banco escalera con una barra de apoyo. Mantenga los cables elctricos fuera del camino. No use un pulidor o cera para pisos que dejen los pisos resbaladizos. Qu puedo hacer con las escaleras? No deje ningn objeto en las escaleras. Asegrese de tener un interruptor de luz en la parte superior e inferior de las escaleras. Asegrese de que haya pasamanos en ambos lados de las escaleras. Repare los pasamanos que estn flojos o rotos. Instale peldaos antideslizantes en todas las escaleras. No coloque alfombras en la parte superior o  inferior de las escaleras. Elija una alfombra que no oculte el borde de los escalones de las escaleras. Verifique las alfombras para asegurarse de estn bien adheridas a las escaleras. [place]. Qu puedo hacer en el exterior de mi casa? Use una iluminacin brillante en el exterior. Arregle los bordes de los senderos y las entradas para autos as  Physiological scientist. Retire las cosas que pueden hacerlo tropezar cuando cruce una puerta, por ejemplo, un escaln o un umbral elevados. Pode los arbustos o los rboles que se encuentran en los senderos hacia su casa. Compruebe si los pasamanos estn sueltos o rotos, y si ambos lados de todos los escalones tienen pasamanos. Instale barandillas de proteccin en los bordes de las terrazas y Psychologist, occupational. Retire los [employer] que estn en el camino y que puedan hacer que se tropiece, como herramientas o piedras. Limpie regularmente las hojas, la nieve o el hielo. Utilice arena o sal en los senderos durante el invierno. Limpie de inmediato los derrames en el garaje. Esto incluye los derrames de [place]. Qu otras medidas puedo tomar? Use calzado con estas caractersticas: Que tenga taco bajo. No use zapatos con tacones altos. Que tenga suela de goma. Que le calcen bien y con comodidad. Que sean cerrados. No use sandalias con punta abierta. Use recursos que lo ayuden a desplazarse, si son necesarios. Estos incluyen: Bastones. Andadores. Patinetes con soporte para el pie. Muletas. Revise los medicamentos con el mdico. Algunos medicamentos pueden hacer que se sienta mareado. Esto puede aumentar el riesgo de caerse. Pregntele al mdico qu otras cosas puede hacer para ayudar a prevenir las cadas. Dnde buscar ms informacin Centers for Disease Control and Prevention (Centros para el Control y la Prevencin de [place]), [place]: http://www.wolf.info/ National Institute on Aging ([place] sobre el Envejecimiento): http://kim-miller.com/ Comunquese con un mdico si: Tiene miedo de caerse en su casa. Se siente dbil, somnoliento o mareado en su casa. Se cae en su casa. Resumen Hay muchas cosas simples que puede hacer para que su casa sea un lugar seguro y ayudar a prevenir las cadas. Algunas formas de garantizar la seguridad en su casa incluyen eliminar cosas con las que pueda tropezar  e instalar barras para sostn en el bao. Pida ayuda cuando haga estos cambios en su hogar. Esta informacin no tiene Marine scientist el consejo del mdico. Asegrese de hacerle al mdico cualquier pregunta que tenga. Document Revised: 10/02/2019 Document Reviewed: 10/02/2019 Elsevier Patient Education  [place].

## 2021-10-01 NOTE — Assessment & Plan Note (Signed)
BP adequately controlled. Continue current management: Triamterene-HCTZ 37.5-25 mg daily, some side effects discussed. DASH/low salt diet to continue. Monitor BP at home. Eye exam current.

## 2021-10-01 NOTE — Assessment & Plan Note (Signed)
Continue a healthy life style for diabetes prevention. Further recommendations according to HgA1C result. 

## 2021-10-04 MED ORDER — POTASSIUM CHLORIDE CRYS ER 20 MEQ PO TBCR: 20.0000 meq | EXTENDED_RELEASE_TABLET | Freq: Every day | ORAL | 0 refills | Status: DC

## 2021-10-24 ENCOUNTER — Other Ambulatory Visit (INDEPENDENT_AMBULATORY_CARE_PROVIDER_SITE_OTHER): Payer: Medicare Other

## 2021-10-24 DIAGNOSIS — E876 Hypokalemia: Secondary | ICD-10-CM | POA: Diagnosis not present

## 2021-10-24 LAB — POTASSIUM: Potassium: 3.5 mEq/L (ref 3.5–5.1)

## 2021-11-07 DIAGNOSIS — Z961 Presence of intraocular lens: Secondary | ICD-10-CM | POA: Diagnosis not present

## 2021-11-07 DIAGNOSIS — H5203 Hypermetropia, bilateral: Secondary | ICD-10-CM | POA: Diagnosis not present

## 2021-11-07 DIAGNOSIS — H40023 Open angle with borderline findings, high risk, bilateral: Secondary | ICD-10-CM | POA: Diagnosis not present

## 2021-11-07 DIAGNOSIS — H35033 Hypertensive retinopathy, bilateral: Secondary | ICD-10-CM | POA: Diagnosis not present

## 2021-11-07 DIAGNOSIS — H04123 Dry eye syndrome of bilateral lacrimal glands: Secondary | ICD-10-CM | POA: Diagnosis not present

## 2021-12-24 ENCOUNTER — Other Ambulatory Visit: Payer: Self-pay | Admitting: Family Medicine

## 2022-02-17 ENCOUNTER — Emergency Department (HOSPITAL_COMMUNITY)
Admission: EM | Admit: 2022-02-17 | Discharge: 2022-02-17 | Disposition: A | Discharge status: Home / Self Care | Payer: Medicare Other | Attending: Emergency Medicine | Admitting: Emergency Medicine

## 2022-02-17 ENCOUNTER — Ambulatory Visit
Admission: EM | Admit: 2022-02-17 | Discharge: 2022-02-17 | Disposition: A | Discharge status: Home / Self Care | Payer: Medicare Other

## 2022-02-17 ENCOUNTER — Other Ambulatory Visit: Payer: Self-pay

## 2022-02-17 DIAGNOSIS — Z79899 Other long term (current) drug therapy: Secondary | ICD-10-CM | POA: Insufficient documentation

## 2022-02-17 DIAGNOSIS — R42 Dizziness and giddiness: Secondary | ICD-10-CM

## 2022-02-17 DIAGNOSIS — Z7982 Long term (current) use of aspirin: Secondary | ICD-10-CM | POA: Diagnosis not present

## 2022-02-17 DIAGNOSIS — I10 Essential (primary) hypertension: Secondary | ICD-10-CM | POA: Insufficient documentation

## 2022-02-17 DIAGNOSIS — U071 COVID-19: Secondary | ICD-10-CM | POA: Diagnosis not present

## 2022-02-17 LAB — CBC WITH DIFFERENTIAL/PLATELET
Abs Immature Granulocytes: 0.01 10*3/uL (ref 0.00–0.07)
Basophils Absolute: 0 10*3/uL (ref 0.0–0.1)
Basophils Relative: 0 %
Eosinophils Absolute: 0.1 10*3/uL (ref 0.0–0.5)
Eosinophils Relative: 1 %
HCT: 47.2 % — ABNORMAL HIGH (ref 36.0–46.0)
Hemoglobin: 15.3 g/dL — ABNORMAL HIGH (ref 12.0–15.0)
Immature Granulocytes: 0 %
Lymphocytes Relative: 29 %
Lymphs Abs: 1.9 10*3/uL (ref 0.7–4.0)
MCH: 27.8 pg (ref 26.0–34.0)
MCHC: 32.4 g/dL (ref 30.0–36.0)
MCV: 85.7 fL (ref 80.0–100.0)
Monocytes Absolute: 0.4 10*3/uL (ref 0.1–1.0)
Monocytes Relative: 6 %
Neutro Abs: 4.3 10*3/uL (ref 1.7–7.7)
Neutrophils Relative %: 64 %
Platelets: 222 10*3/uL (ref 150–400)
RBC: 5.51 MIL/uL — ABNORMAL HIGH (ref 3.87–5.11)
RDW: 13.4 % (ref 11.5–15.5)
WBC: 6.8 10*3/uL (ref 4.0–10.5)
nRBC: 0 % (ref 0.0–0.2)

## 2022-02-17 LAB — RESP PANEL BY RT-PCR (RSV, FLU A&B, COVID)  RVPGX2
Influenza A by PCR: NEGATIVE
Influenza B by PCR: NEGATIVE
Resp Syncytial Virus by PCR: NEGATIVE
SARS Coronavirus 2 by RT PCR: POSITIVE — AB

## 2022-02-17 LAB — BASIC METABOLIC PANEL
Anion gap: 11 (ref 5–15)
BUN: 16 mg/dL (ref 8–23)
CO2: 26 mmol/L (ref 22–32)
Calcium: 9 mg/dL (ref 8.9–10.3)
Chloride: 100 mmol/L (ref 98–111)
Creatinine, Ser: 0.69 mg/dL (ref 0.44–1.00)
GFR, Estimated: >60 mL/min (ref >60)
Glucose, Bld: 100 mg/dL — ABNORMAL HIGH (ref 70–99)
Potassium: 3.7 mmol/L (ref 3.5–5.1)
Sodium: 137 mmol/L (ref 135–145)

## 2022-02-17 MED ORDER — MECLIZINE HCL 12.5 MG PO TABS: 12.5000 mg | ORAL_TABLET | Freq: Two times a day (BID) | ORAL | 0 refills | Status: DC

## 2022-02-17 MED ORDER — SODIUM CHLORIDE 0.9 % IV BOLUS
1000.0000 mL | Freq: Once | INTRAVENOUS | Status: AC
  Administered 2022-02-17: 1000 mL via INTRAVENOUS

## 2022-02-17 MED ORDER — MECLIZINE HCL 25 MG PO TABS
12.5000 mg | ORAL_TABLET | Freq: Once | ORAL | Status: AC
  Administered 2022-02-17: 12.5 mg via ORAL
  Filled 2022-02-17: qty 1

## 2022-02-17 NOTE — Discharge Instructions (Addendum)
It was a pleasure meeting you. You came in for some dizziness, and looks like this was an exacerbation of your vertigo probably because of her COVID infection.  You were given some fluids, and a medication called meclizine and started to do better.  I am sending some meclizine to your pharmacy, please take no more than 2 times a day.  Please remember to drink a lot of water.   Ha sido Engineer, technical sales. Llegaste con [employer] y parece que esto fue una exacerbacin de tu vrtigo, probablemente debido a su infeccin por COVID.  Le dieron algunos lquidos y un medicamento llamado meclizina y comenz a Teacher, English as a foreign language.  Estoy enviando un poco de meclizina a su farmacia, por favor no tome ms de 2 veces al [employer].  Recuerde beber [employer].

## 2022-02-17 NOTE — Discharge Instructions (Addendum)
You declined EMS transfer to the emergency room.  Please note risk of going private vehicle include stroke, permanent disability, and/or death.  Please go to [facility] emergency room for further evaluation of your symptoms.  Pull over and call 911 if you have any worsening symptoms that occur in transit

## 2022-02-17 NOTE — ED Triage Notes (Addendum)
Pt reports having Covid in December and is now having left ear discomfort, cough, left sided throat pain, and an episode of vertigo/ dizziness (started yesterday).   Home interventions: none

## 2022-02-17 NOTE — ED Provider Triage Note (Signed)
Emergency Medicine Provider Triage Evaluation Note  Barbara Washington , a 77 y.o. female  was evaluated in triage.  Pt complains of dizziness.  Patient reports she has had vertigo for the last day particularly when turning her head or bending over.  She reports she has had this sensation previously about 8 years ago which resolved on its own with some medication.  Patient also reporting pain in her left thigh, left ear, and left side of her throat with associated cough and nausea.  Patient denies any fever, chest pain, shortness of breath, abdominal pain, diarrhea.  Review of Systems  Positive: As above Negative: As above  Physical Exam  BP (!) 125/93   Pulse 62   Temp 98 F (36.7 C)   Resp 16   Wt 66 kg   SpO2 95%   BMI 32.62 kg/m  Gen:   Awake, no distress   Resp:  Normal effort clear to auscultation bilaterally MSK:   Moves extremities without difficulty  Other:  PERRL, EOMs intact  Medical Decision Making  Medically screening exam initiated at 1:43 PM.  Appropriate orders placed.  Barbara Washington was informed that the remainder of the evaluation will be completed by another provider, this initial triage assessment does not replace that evaluation, and the importance of remaining in the ED until their evaluation is complete.     Barbara Heller, PA-C 02/17/22 1344

## 2022-02-17 NOTE — ED Provider Notes (Signed)
[place] EMERGENCY DEPARTMENT AT [facility] Provider Note   CSN: [id number] Arrival date & time: 02/17/22  1258     History Chief Complaint  Patient presents with   Dizziness    Barbara Washington is a 77 y.o. female with past medical history of hypertension and hyperlipidemia who presents with a 1 day history of dizziness.  She states that yesterday, she noticed that every time she would look to her left, she felt like the room was spinning.  She has a history of benign paroxysmal positional vertigo.  This is also associated with left-sided ear pain, and some discomfort in her eye as well.  She does endorse having cough and congestion as well.  Of note, patient was seen earlier today in urgent care and was set to have new focal deficits, and was sent to this emergency department for further evaluation.   Dizziness      Home Medications Prior to Admission medications   Medication Sig Start Date End Date Taking? Authorizing Provider  meclizine (ANTIVERT) 12.5 MG tablet Take 1 tablet (12.5 mg total) by mouth 2 (two) times daily. 02/17/22  Yes Nickalos Petersen, Jason Fila, MD  aspirin EC 81 MG tablet Take 81 mg by mouth daily.    [provider]  metoprolol succinate (TOPROL-XL) 25 MG 24 hr tablet Take 1 tablet by mouth once daily 12/24/21   [place], Betty G, MD  omeprazole (PRILOSEC) 40 MG capsule Take 40 mg by mouth daily.    [provider]  potassium chloride SA (KLOR-CON M) 20 MEQ tablet Take 1 tablet (20 mEq total) by mouth daily for 7 days. 10/04/21 10/11/21  [place], Betty G, MD  rosuvastatin (CRESTOR) 10 MG tablet Take 1 tablet by mouth once daily 12/24/21   [place], Betty G, MD  triamterene-hydrochlorothiazide [facility]) 37.5-25 MG tablet Take 1 tablet by mouth once daily 12/24/21   [place], Betty G, MD      Allergies    Losartan, Ibuprofen, and Tape    Review of Systems   Review of Systems  Neurological:  Positive for dizziness.    Physical  Exam Updated Vital Signs BP 132/60 (BP Location: Left Arm)   Pulse 78   Temp 97.8 F (36.6 C) (Oral)   Resp 18   Wt 66 kg   SpO2 100%   BMI 32.62 kg/m  Physical Exam Constitutional:      General: She is not in acute distress.    Appearance: She is not ill-appearing or diaphoretic.  HENT:     Head: Normocephalic.     Comments: Head Tilt Test Positive on Left Side    Left Ear: External ear normal.  Cardiovascular:     Rate and Rhythm: Normal rate and regular rhythm.     Pulses: Normal pulses.  Neurological:     Mental Status: She is alert.     ED Results / Procedures / Treatments   Labs (all labs ordered are listed, but only abnormal results are displayed) Labs Reviewed  RESP PANEL BY RT-PCR (RSV, FLU A&B, COVID)  RVPGX2 - Abnormal; Notable for the following components:      Result Value   SARS Coronavirus 2 by RT PCR POSITIVE (*)    All other components within normal limits  BASIC METABOLIC PANEL - Abnormal; Notable for the following components:   Glucose, Bld 100 (*)    All other components within normal limits  CBC WITH DIFFERENTIAL/PLATELET - Abnormal; Notable for the following components:   RBC 5.51 (*)  Hemoglobin 15.3 (*)    HCT 47.2 (*)    All other components within normal limits    EKG EKG Interpretation  Date/Time:  Tuesday February 17 2022 20:58:15 EST Ventricular Rate:  63 PR Interval:  180 QRS Duration: 94 QT Interval:  409 QTC Calculation: 419 R Axis:   30 Text Interpretation: Sinus rhythm Low voltage, precordial leads twi lead 3 no stemi Confirmed by Tanda Rockers ([id number]) on 02/17/2022 9:30:24 PM  Radiology No results found.  Procedures Procedures   Medications Ordered in ED Medications  meclizine (ANTIVERT) tablet 12.5 mg (12.5 mg Oral Given 02/17/22 2152)  sodium chloride 0.9 % bolus 1,000 mL (0 mLs Intravenous Stopped 02/17/22 2239)    ED Course/ Medical Decision Making/ A&P    Medical Decision Making Patient presents with a 1 day  history of positional vertigo symptoms.  She states this feels similar to her other episodes a few years ago.  Differentials include acute CVA, infection, BPPV, electrolyte abnormality.  Patient also tested positive for COVID, per daughter states that she likely was infected a couple weeks ago.  Labs are within normal limits, no electrolyte abnormalities or anemia that could trigger dizziness.  White blood cell count within normal limits as well, less concern for infection.  On my exam, she does not have any neurologic deficits.  This could be BPPV exacerbation in the setting of COVID-19 infection.  After one dose of meclizine and normal saline bolus, patient had profound improvement in symptoms and was feeling back to 100%.  Will give a 2-week course of meclizine 12.5 mg twice a day, and recommended to patient to follow-up with her PCP for vestibular rehab or ENT referral if symptoms persist.      Final Clinical Impression(s) / ED Diagnoses Final diagnoses:  Vertigo    Rx / DC Orders ED Discharge Orders          Ordered    meclizine (ANTIVERT) 12.5 MG tablet  2 times daily        02/17/22 2304              Isobel Eisenhuth, Jason Fila, MD 02/17/22 2326    Sloan Leiter, DO 02/18/22 1610

## 2022-02-17 NOTE — ED Notes (Addendum)
Patient is being discharged from the Urgent Care and sent to the Emergency Department via POV with family (per request- refusal of EMS usage) . Per Talbert Cage NP, patient is in need of higher level of care due to need for further evaluation . Patient is aware and verbalizes understanding of plan of care.  Vitals:   02/17/22 1207  BP: (!) 146/90  Pulse: 67  Resp: 16  Temp: 97.9 F (36.6 C)  SpO2: 95%

## 2022-02-17 NOTE — ED Provider Notes (Signed)
[facility]    CSN: [id number] Arrival date & time: 02/17/22  1154      History   Chief Complaint Chief Complaint  Patient presents with   Dizziness   Ear Fullness    HPI Ramata Strothman is a 77 y.o. female presents for evaluation of dizziness.  Patient is Spanish-speaking and interpretation line used.  Patient reports yesterday she noticed some left ear discomfort with left throat pain and dizziness.  Denies fevers or chills.  States the dizziness occurs primarily when she turns her head to the left or changes position and does improve with rest.  She has a history of vertigo several years ago but states her presentation today is different than her typical vertigo symptoms.  She does endorse left sided visual changes but denies syncope, headache, unilateral weakness.  She does have a history of hypertension and hyperlipidemia.  Denies history of heart attack or stroke.  She has not taken any OTC medications.  No other concerns at this time.   Dizziness Ear Fullness    Past Medical History:  Diagnosis Date   Cataract    DCIS (ductal carcinoma in situ) of breast    Diverticular disease    Hyperlipidemia    Hypertension    Mitral valve prolapse     Patient Active Problem List   Diagnosis Date Noted   Arthralgia of both hands 02/11/2021   Hypertension, essential, benign 07/28/2019   Hyperlipidemia, unspecified 07/28/2019   Prediabetes 07/28/2019   Atherosclerosis of aorta ([place]) 07/28/2019    Past Surgical History:  Procedure Laterality Date   ABDOMINAL HYSTERECTOMY  2008   ABDOMINAL HYST - NEW [place]   BREAST SURGERY     [place]   CATARACT EXTRACTION, BILATERAL     EYE SURGERY     TOTAL ABDOMINAL HYSTERECTOMY W/ BILATERAL SALPINGOOPHORECTOMY      OB History     Gravida  2   Para  2   Term      Preterm      AB      Living  2      SAB      IAB      Ectopic      Multiple      Live Births               Home  Medications    Prior to Admission medications   Medication Sig Start Date End Date Taking? Authorizing Provider  aspirin EC 81 MG tablet Take 81 mg by mouth daily.    [provider]  metoprolol succinate (TOPROL-XL) 25 MG 24 hr tablet Take 1 tablet by mouth once daily 12/24/21   [place], Betty G, MD  omeprazole (PRILOSEC) 40 MG capsule Take 40 mg by mouth daily.    [provider]  potassium chloride SA (KLOR-CON M) 20 MEQ tablet Take 1 tablet (20 mEq total) by mouth daily for 7 days. 10/04/21 10/11/21  [place], Betty G, MD  rosuvastatin (CRESTOR) 10 MG tablet Take 1 tablet by mouth once daily 12/24/21   [place], Betty G, MD  triamterene-hydrochlorothiazide [facility]) 37.5-25 MG tablet Take 1 tablet by mouth once daily 12/24/21   [place], Betty G, MD    Family History Family History  Problem Relation Age of Onset   Hypertension Father    Hypertension Sister    Diabetes Brother     Social History Social History   Tobacco Use   Smoking status: Never   Smokeless tobacco: Never  Substance Use Topics   Alcohol use: Yes    Alcohol/week: 0.0 - 1.0 standard drinks of alcohol   Drug use: No     Allergies   Losartan, Ibuprofen, and Tape   Review of Systems Review of Systems  HENT:  Positive for ear pain and sore throat.   Neurological:  Positive for dizziness.     Physical Exam Triage Vital Signs ED Triage Vitals  Enc Vitals Group     BP 02/17/22 1207 (!) 146/90     Pulse Rate 02/17/22 1207 67     Resp 02/17/22 1207 16     Temp 02/17/22 1207 97.9 F (36.6 C)     Temp Source 02/17/22 1207 Oral     SpO2 02/17/22 1207 95 %     Weight --      Height --      Head Circumference --      Peak Flow --      Pain Score 02/17/22 1206 4     Pain Loc --      Pain Edu? --      Excl. in [place]? --    No data found.  Updated Vital Signs BP (!) 146/90 (BP Location: Left Arm)   Pulse 67   Temp 97.9 F (36.6 C) (Oral)   Resp 16   SpO2 95%   Visual Acuity Right  Eye Distance:   Left Eye Distance:   Bilateral Distance:    Right Eye Near:   Left Eye Near:    Bilateral Near:     Physical Exam Vitals and nursing note reviewed.  Constitutional:      General: She is not in acute distress.    Appearance: Normal appearance. She is not ill-appearing.  HENT:     Head: Normocephalic and atraumatic.     Right Ear: Tympanic membrane and ear canal normal.     Left Ear: Tympanic membrane and ear canal normal.  Eyes:     Extraocular Movements: Extraocular movements intact.     Conjunctiva/sclera: Conjunctivae normal.     Pupils: Pupils are equal, round, and reactive to light.  Cardiovascular:     Rate and Rhythm: Normal rate.  Pulmonary:     Effort: Pulmonary effort is normal.  Neurological:     General: No focal deficit present.     Mental Status: She is alert and oriented to person, place, and time.     GCS: GCS eye subscore is 4. GCS verbal subscore is 5. GCS motor subscore is 6.     Cranial Nerves: No facial asymmetry.     Motor: No weakness.     Coordination: Romberg sign positive.     Gait: Gait normal.     Comments: Strength 5 out of 5 bilateral upper extremities  Psychiatric:        Mood and Affect: Mood normal.        Behavior: Behavior normal.      UC Treatments / Results  Labs (all labs ordered are listed, but only abnormal results are displayed) Labs Reviewed - No data to display  EKG   Radiology No results found.  Procedures Procedures (including critical care time)  Medications Ordered in UC Medications - No data to display  Initial Impression / Assessment and Plan / UC Course  I have reviewed the triage vital signs and the nursing notes.  Pertinent labs & imaging results that were available during my care of the patient were reviewed by me and considered in  my medical decision making (see chart for details).     I reviewed exam and symptoms with patient in length.  Onset of dizziness yesterday that is different  than her typical presentation for vertigo.   Discussed need to rule out stroke.  Advised patient go EMS to emergency room for further evaluation.  She did decline EMS transfer and states her husband will drive her POV to [facility] emergency room.  Discussed risk of going POV include stroke, permanent disability, and/or death and she verbalized understanding.  She was instructed to pull over and call 911 for any worsening symptoms that occur in transit. Final Clinical Impressions(s) / UC Diagnoses   Final diagnoses:  Dizziness     Discharge Instructions      You declined EMS transfer to the emergency room.  Please note risk of going private vehicle include stroke, permanent disability, and/or death.  Please go to [facility] emergency room for further evaluation of your symptoms.  Pull over and call 911 if you have any worsening symptoms that occur in transit   ED Prescriptions   None    PDMP not reviewed this encounter.   Melynda Ripple, NP 02/17/22 1235

## 2022-02-17 NOTE — ED Triage Notes (Signed)
C/o vertigo x1 day that is worse when turning head or bending over.  Patient also reports pain to left eye, left ear, and left side of throat with cough and nausea Reports taking meds for vertigo 8 years ago.  Denies cp, sob

## 2022-02-20 ENCOUNTER — Telehealth: Payer: Self-pay | Admitting: *Deleted

## 2022-02-20 NOTE — Telephone Encounter (Signed)
        Patient  visited [facility] on 09/07/2022  for treatment   Telephone encounter attempt :  1st  A HIPAA compliant voice message was left requesting a return call.  Instructed patient to call back at [phone].  [facility] [phone] [address] , [place] [address] Email : [email or web address]

## 2022-03-31 NOTE — Progress Notes (Unsigned)
HPI: Barbara Washington is a 77 y.o. female, who is here today for her routine physical.  Last CPE: unsure, last AWV 04/11/21  she used to attend Zumba classes daily but stopped due to the dizziness, sprain, and transportation issues. She now exercises at the gym, focusing on arm exercises and swimming in the pool. She goes to the gym almost every day, depending on her schedule. Regarding her diet, she mentioned that she eats healthily, avoiding cheese, fried foods, and eating out due to stomach issues. She consumes vegetables and salads daily. The patient reported sleeping for 4-5 hours per night but waking up feeling rested. She does not smoke or consume significant amounts of alcoho  Chronic medical problems: ***  Immunization History  Administered Date(s) Administered   Influenza, High Dose Seasonal PF 09/25/2015, 10/29/2016, 11/06/2016, 12/14/2017, 12/21/2018, 10/08/2020   Janssen (J&J) SARS-COV-2 Vaccination 04/01/2019   PFIZER(Purple Top)SARS-COV-2 Vaccination 01/24/2020, 11/06/2020   Pneumococcal Conjugate-13 10/11/2013   Pneumococcal Polysaccharide-23 08/07/2010   Tdap 11/29/2009, 03/04/2010, 02/13/2011   Zoster, Live 08/07/2010   Health Maintenance  Topic Date Due   Medicare Annual Wellness (AWV)  04/12/2022   DEXA SCAN  04/12/2022 (Originally 09/07/2010)   COVID-19 Vaccine (4 - 2023-24 season) 04/16/2022 (Originally 09/26/2021)   INFLUENZA VACCINE  04/26/2022 (Originally 08/26/2021)   Zoster Vaccines- Shingrix (1 of 2) 01/29/2023 (Originally 09/07/1995)   Hepatitis C Screening  Completed   HPV VACCINES  Aged Out   DTaP/Tdap/Td  Discontinued   Pneumonia Vaccine 78+ Years old  Discontinued   COLONOSCOPY (Pts 45-32yr Insurance coverage will need to be confirmed)  Discontinued    She has *** concerns today. Lab Results  Component Value Date   CHOL 163 02/11/2021   HDL 70.00 02/11/2021   LDLCALC 69 02/11/2021   TRIG 117.0 02/11/2021   CHOLHDL 2 02/11/2021   Lab  Results  Component Value Date   CREATININE 0.69 02/17/2022   BUN 16 02/17/2022   NA 137 02/17/2022   K 3.7 02/17/2022   CL 100 02/17/2022   CO2 26 02/17/2022   Lab Results  Component Value Date   HGBA1C 6.2 10/01/2021  She reported a history of dizziness, describing it as a spinning sensation that prevents her from turning her head. The dizziness began in January and has persisted since then, although she noted some improvement in the intensity of the spinning. She has not experienced any chest pain, palpitations, difficulty breathing, or changes in hearing associated with the dizziness. The patient mentioned having nausea on the first day of the dizziness, but not after that. Lab Results  Component Value Date   WBC 6.8 02/17/2022   HGB 15.3 (H) 02/17/2022   HCT 47.2 (H) 02/17/2022   MCV 85.7 02/17/2022   PLT 222 02/17/2022    Review of Systems  Current Outpatient Medications on File Prior to Visit  Medication Sig Dispense Refill   metoprolol succinate (TOPROL-XL) 25 MG 24 hr tablet Take 1 tablet by mouth once daily 90 tablet 2   omeprazole (PRILOSEC) 40 MG capsule Take 40 mg by mouth daily.     rosuvastatin (CRESTOR) 10 MG tablet Take 1 tablet by mouth once daily 90 tablet 2   triamterene-hydrochlorothiazide (MAXZIDE-25) 37.5-25 MG tablet Take 1 tablet by mouth once daily 90 tablet 2   potassium chloride SA (KLOR-CON M) 20 MEQ tablet Take 1 tablet (20 mEq total) by mouth daily for 7 days. 7 tablet 0   No current facility-administered medications on file prior to visit.  Past Medical History:  Diagnosis Date   Cataract    DCIS (ductal carcinoma in situ) of breast    Diverticular disease    Hyperlipidemia    Hypertension    Mitral valve prolapse    Past Surgical History:  Procedure Laterality Date   ABDOMINAL HYSTERECTOMY  2008   ABDOMINAL HYST - [place]   BREAST SURGERY     WISCONSIN   CATARACT EXTRACTION, BILATERAL     EYE SURGERY     TOTAL ABDOMINAL  HYSTERECTOMY W/ BILATERAL SALPINGOOPHORECTOMY      Allergies  Allergen Reactions   Losartan Swelling   Aspirin Swelling   Ibuprofen Nausea And Vomiting and Other (See Comments)   Tape Rash   Family History  Problem Relation Age of Onset   Hypertension Father    Hypertension Sister    Diabetes Brother    Social History   Socioeconomic History   Marital status: Married    Spouse name: Not on file   Number of children: Not on file   Years of education: Not on file   Highest education level: Not on file  Occupational History   Not on file  Tobacco Use   Smoking status: Never   Smokeless tobacco: Never  Substance and Sexual Activity   Alcohol use: Yes    Alcohol/week: 0.0 - 1.0 standard drinks of alcohol   Drug use: No   Sexual activity: Yes    Birth control/protection: Surgical  Other Topics Concern   Not on file  Social History Narrative   Not on file   Social Determinants of Health   Financial Resource Strain: Low Risk  (04/11/2021)   Overall Financial Resource Strain (CARDIA)    Difficulty of Paying Living Expenses: Not hard at all  Food Insecurity: No Food Insecurity (04/11/2021)   Hunger Vital Sign    Worried About Running Out of Food in the Last Year: Never true    Ran Out of Food in the Last Year: Never true  Transportation Needs: No Transportation Needs (04/11/2021)   PRAPARE - Hydrologist (Medical): No    Lack of Transportation (Non-Medical): No  Physical Activity: Sufficiently Active (04/11/2021)   Exercise Vital Sign    Days of Exercise per Week: 3 days    Minutes of Exercise per Session: 60 min  Stress: No Stress Concern Present (04/11/2021)   [place]    Feeling of Stress : Not at all  Social Connections: [place] (04/11/2021)   Social Connection and Isolation Panel [NHANES]    Frequency of Communication with Friends and Family: More than three  times a week    Frequency of Social Gatherings with Friends and Family: More than three times a week    Attends Religious Services: More than 4 times per year    Active Member of [employer] or Organizations: Yes    Attends Archivist Meetings: More than 4 times per year    Marital Status: Married   Vitals:   04/01/22 0817  BP: 120/70  Pulse: 63  Resp: 16  Temp: 98.4 F (36.9 C)  SpO2: 98%  Body mass index is 33.69 kg/m. Wt Readings from Last 3 Encounters:  04/01/22 150 lb 4 oz (68.2 kg)  02/17/22 145 lb 8.1 oz (66 kg)  10/01/21 147 lb 7 oz (66.9 kg)   Physical Exam Vitals and nursing note reviewed.  Constitutional:      General: She  is not in acute distress.    Appearance: She is well-developed.  HENT:     Head: Normocephalic and atraumatic.     Comments: Excess cerumen in right ear canal, cannot seem TM.    Right Ear: Hearing and external ear normal.     Left Ear: Hearing, tympanic membrane, ear canal and external ear normal.     Mouth/Throat:     Mouth: Mucous membranes are moist.     Pharynx: Oropharynx is clear. Uvula midline.  Eyes:     Extraocular Movements: Extraocular movements intact.     Conjunctiva/sclera: Conjunctivae normal.     Pupils: Pupils are equal, round, and reactive to light.  Neck:     Thyroid: No thyromegaly.     Trachea: No tracheal deviation.  Cardiovascular:     Rate and Rhythm: Normal rate and regular rhythm.     Pulses:          Dorsalis pedis pulses are 2+ on the right side and 2+ on the left side.       Posterior tibial pulses are 2+ on the right side and 2+ on the left side.     Heart sounds: No murmur heard. Pulmonary:     Effort: Pulmonary effort is normal. No respiratory distress.     Breath sounds: Normal breath sounds.  Abdominal:     Palpations: Abdomen is soft. There is no hepatomegaly or mass.     Tenderness: There is no abdominal tenderness.  Genitourinary:    Comments: Deferred to gyn. Musculoskeletal:      Comments: No major deformity or signs of synovitis appreciated.  Lymphadenopathy:     Cervical: No cervical adenopathy.     Upper Body:     Right upper body: No supraclavicular adenopathy.     Left upper body: No supraclavicular adenopathy.  Skin:    General: Skin is warm.     Findings: No erythema or rash.  Neurological:     General: No focal deficit present.     Mental Status: She is alert and oriented to person, place, and time.     Cranial Nerves: No cranial nerve deficit.     Coordination: Coordination normal.     Gait: Gait normal.     Deep Tendon Reflexes:     Reflex Scores:      Bicep reflexes are 2+ on the right side and 2+ on the left side.      Patellar reflexes are 2+ on the right side and 2+ on the left side. Psychiatric:     Comments: Well groomed, good eye contact.   ASSESSMENT AND PLAN: Barbara Washington was here today annual physical examination.  Orders Placed This Encounter  Procedures   DG BONE DENSITY (DXA)   Comprehensive metabolic panel   Hemoglobin A1c   Lipid panel   Ambulatory referral to Gastroenterology   Ambulatory referral to Physical Therapy    Krithika was seen today for annual exam.  Diagnoses and all orders for this visit:  Routine general medical examination at a health care facility  Hypertension, essential, benign -     Comprehensive metabolic panel; Future  Prediabetes -     Hemoglobin A1c; Future  Atherosclerosis of aorta (HCC) -     Lipid panel; Future  Screen for colon cancer -     Ambulatory referral to Gastroenterology  Benign paroxysmal positional vertigo, unspecified laterality -     Ambulatory referral to Physical Therapy  Asymptomatic postmenopausal estrogen deficiency -  DG BONE DENSITY (DXA); Future  Other orders -     meclizine (ANTIVERT) 12.5 MG tablet; Take 1 tablet (12.5 mg total) by mouth 2 (two) times daily as needed for dizziness.   Routine general medical examination at a health care  facility  Hypertension, essential, benign -     Comprehensive metabolic panel; Future  Prediabetes -     Hemoglobin A1c; Future  Atherosclerosis of aorta (HCC) -     Lipid panel; Future  Screen for colon cancer -     Ambulatory referral to Gastroenterology  Benign paroxysmal positional vertigo, unspecified laterality -     Ambulatory referral to Physical Therapy  Asymptomatic postmenopausal estrogen deficiency -     DG Bone Density; Future  Other orders -     Meclizine HCl; Take 1 tablet (12.5 mg total) by mouth 2 (two) times daily as needed for dizziness.  Dispense: 30 tablet; Refill: 1  Return in 6 months (on 10/02/2022) for chronic problems.  Sariya Trickey G. Martinique, MD  [facility]. [place] office.

## 2022-04-01 ENCOUNTER — Encounter: Payer: Self-pay | Admitting: Family Medicine

## 2022-04-01 ENCOUNTER — Ambulatory Visit (INDEPENDENT_AMBULATORY_CARE_PROVIDER_SITE_OTHER): Payer: Medicare Other | Admitting: Family Medicine

## 2022-04-01 VITALS — BP 120/70 | HR 63 | Temp 98.4°F | Resp 16 | Ht <= 58 in | Wt 150.2 lb

## 2022-04-01 DIAGNOSIS — Z780 Asymptomatic menopausal state: Secondary | ICD-10-CM | POA: Diagnosis not present

## 2022-04-01 DIAGNOSIS — Z0000 Encounter for general adult medical examination without abnormal findings: Secondary | ICD-10-CM | POA: Diagnosis not present

## 2022-04-01 DIAGNOSIS — R7303 Prediabetes: Secondary | ICD-10-CM | POA: Diagnosis not present

## 2022-04-01 DIAGNOSIS — E785 Hyperlipidemia, unspecified: Secondary | ICD-10-CM

## 2022-04-01 DIAGNOSIS — H8110 Benign paroxysmal vertigo, unspecified ear: Secondary | ICD-10-CM | POA: Diagnosis not present

## 2022-04-01 DIAGNOSIS — I700 Atherosclerosis of aorta: Secondary | ICD-10-CM | POA: Diagnosis not present

## 2022-04-01 DIAGNOSIS — Z1211 Encounter for screening for malignant neoplasm of colon: Secondary | ICD-10-CM

## 2022-04-01 DIAGNOSIS — I10 Essential (primary) hypertension: Secondary | ICD-10-CM | POA: Diagnosis not present

## 2022-04-01 LAB — COMPREHENSIVE METABOLIC PANEL
ALT: 16 U/L (ref 0–35)
AST: 21 U/L (ref 0–37)
Albumin: 3.9 g/dL (ref 3.5–5.2)
Alkaline Phosphatase: 77 U/L (ref 39–117)
BUN: 16 mg/dL (ref 6–23)
CO2: 30 mEq/L (ref 19–32)
Calcium: 9.6 mg/dL (ref 8.4–10.5)
Chloride: 101 mEq/L (ref 96–112)
Creatinine, Ser: 0.7 mg/dL (ref 0.40–1.20)
GFR: 83.92 mL/min (ref >60.00)
Glucose, Bld: 107 mg/dL — ABNORMAL HIGH (ref 70–99)
Potassium: 3.7 mEq/L (ref 3.5–5.1)
Sodium: 140 mEq/L (ref 135–145)
Total Bilirubin: 0.5 mg/dL (ref 0.2–1.2)
Total Protein: 7.2 g/dL (ref 6.0–8.3)

## 2022-04-01 LAB — LIPID PANEL
Cholesterol: 148 mg/dL (ref 0–200)
HDL: 66.1 mg/dL (ref >39.00)
LDL Cholesterol: 55 mg/dL (ref 0–99)
NonHDL: 82.1
Total CHOL/HDL Ratio: 2
Triglycerides: 134 mg/dL (ref 0.0–149.0)
VLDL: 26.8 mg/dL (ref 0.0–40.0)

## 2022-04-01 LAB — HEMOGLOBIN A1C: Hgb A1c MFr Bld: 6 % (ref 4.6–6.5)

## 2022-04-01 MED ORDER — MECLIZINE HCL 12.5 MG PO TABS: 12.5000 mg | ORAL_TABLET | Freq: Two times a day (BID) | ORAL | 1 refills | Status: DC | PRN

## 2022-04-01 NOTE — Patient Instructions (Addendum)
A few things to remember from today's visit:  Routine general medical examination at a health care facility  Hypertension, essential, benign - Plan: Comprehensive metabolic panel  Prediabetes - Plan: Hemoglobin A1c  Atherosclerosis of aorta ([place]), Chronic - Plan: Lipid panel  Screen for colon cancer - Plan: Ambulatory referral to Gastroenterology  Benign paroxysmal positional vertigo, unspecified laterality - Plan: Ambulatory referral to Physical Therapy  Asymptomatic postmenopausal estrogen deficiency - Plan: DG BONE DENSITY (DXA)  Continue el meclizine. No cambios hoy.  If you need refills for medications you take chronically, please call your pharmacy. Do not use My Chart to request refills or for acute issues that need immediate attention. If you send a my chart message, it may take a few days to be addressed, specially if I am not in the office.  Please be sure medication list is accurate. If a new problem present, please set up appointment sooner than planned today.

## 2022-04-02 NOTE — Assessment & Plan Note (Signed)
Encouraged to continue a healthy life style for diabetes prevention. Further recommendations according to HgA1C result.

## 2022-04-02 NOTE — Assessment & Plan Note (Signed)
BP adequately controlled. Continue Triamterene-HCTZ 37.5-25 mg daily, some side effects discussed as well as low salt diet. Eye exam current.

## 2022-04-02 NOTE — Assessment & Plan Note (Signed)
We discussed the importance of regular physical activity and healthy diet for prevention of chronic illness and/or complications. Preventive guidelines reviewed. Vaccination: She needs her 2nd Shingrix, recommend going to her pharmacy. Ca++ and vit D supplementation to continue. Next CPE in a year.

## 2022-04-02 NOTE — Assessment & Plan Note (Signed)
Seen on abdominal/pelvic CT in 06/2017. Peripheral pulses palpable. Continue Rosuvastatin 10 mg daily. Further recommendations according to FLP result.

## 2022-04-02 NOTE — Assessment & Plan Note (Signed)
Continue Crestor 10 mg daily and low fat diet.

## 2022-04-02 NOTE — Assessment & Plan Note (Signed)
We discussed other possible etiologies of dizziness, Hx suggest benign vertigo. I do not think further work-up is necessary at this time but if not resolved after vestibular therapy or if problem gets worse ENT evaluation can be arranged. Explained that problem can be recurrent. Fall prevention. Vestibular PT recommended, referral placed. Meclizine 12.5 mg has helped, continue at bedtime as needed. Some side effects discussed. Instructed about warning signs.

## 2022-04-03 NOTE — Therapy (Signed)
OUTPATIENT PHYSICAL THERAPY VESTIBULAR EVALUATION     Patient Name: Barbara Washington MRN: [id number] DOB:17-Feb-1945, 77 y.o., female Today's Date: 04/03/2022  END OF SESSION:   Past Medical History:  Diagnosis Date   Cataract    DCIS (ductal carcinoma in situ) of breast    Diverticular disease    Hyperlipidemia    Hypertension    Mitral valve prolapse    Past Surgical History:  Procedure Laterality Date   ABDOMINAL HYSTERECTOMY  2008   ABDOMINAL HYST - [place]   BREAST SURGERY     WISCONSIN   CATARACT EXTRACTION, BILATERAL     EYE SURGERY     TOTAL ABDOMINAL HYSTERECTOMY W/ BILATERAL SALPINGOOPHORECTOMY     Patient Active Problem List   Diagnosis Date Noted   Routine general medical examination at a health care facility 04/01/2022   Benign paroxysmal positional vertigo 04/01/2022   Arthralgia of both hands 02/11/2021   Hypertension, essential, benign 07/28/2019   Hyperlipidemia, unspecified 07/28/2019   Prediabetes 07/28/2019   Atherosclerosis of aorta ([place]) 07/28/2019    PCP: Martinique, Betty G, MD  REFERRING PROVIDER: Martinique, Betty G, MD   REFERRING DIAG: H81.10 (ICD-10-CM) - Benign paroxysmal positional vertigo, unspecified laterality  THERAPY DIAG:  No diagnosis found.  ONSET DATE: ***  Rationale for Evaluation and Treatment: Rehabilitation  SUBJECTIVE:   SUBJECTIVE STATEMENT: *** Pt accompanied by: {accompnied:27141}  PERTINENT HISTORY: HTN,prediabetes, HLD, breast CA s/p surgery   PAIN:  Are you having pain? {OPRCPAIN:27236}  PRECAUTIONS: {Therapy precautions:24002}  WEIGHT BEARING RESTRICTIONS: No  FALLS: Has patient fallen in last 6 months? {fallsyesno:27318}  LIVING ENVIRONMENT: Lives with: {OPRC lives with:25569::"lives with their family"} Lives in: {Lives in:25570} Stairs: {opstairs:27293} Has following equipment at home: {Assistive devices:23999}  PLOF: {PLOF:24004}  PATIENT GOALS: ***  OBJECTIVE:   DIAGNOSTIC  FINDINGS: none recent  COGNITION: Overall cognitive status: {cognition:24006}   SENSATION: {sensation:27233}  POSTURE:  {posture:25561}  GAIT: Gait pattern: {gait characteristics:25376} Distance walked: *** Assistive device utilized: {Assistive devices:23999} Level of assistance: {Levels of assistance:24026} Comments: ***  FUNCTIONAL TESTS:  {Functional tests:24029}  PATIENT SURVEYS:  FOTO ***  VESTIBULAR ASSESSMENT:  GENERAL OBSERVATION: ***  OCULOMOTOR EXAM:  Ocular Alignment: {Ocular Alignment:25262}  Ocular ROM: {RANGE OF MOTION:21649}  Spontaneous Nystagmus: {Spontaneous nystagmus:25263}  Gaze-Induced Nystagmus: {gaze-induced nystagmus:25264}  Smooth Pursuits: {smooth pursuit:25265}  Saccades: {saccades:25266}  Convergence/Divergence: *** cm   VESTIBULAR - OCULAR REFLEX:   Slow VOR: {slow VOR:25290}  VOR Cancellation: {vor cancellation:25291}  Head-Impulse Test: {head impulse test:25272}  Dynamic Visual Acuity: {dynamic visual acuity:25273}    POSITIONAL TESTING:  Right Roll Test: ***; Duration: *** Left Roll Test: ***; Duration: *** Right Dix-Hallpike: ***; Duration:*** Left Dix-Hallpike: ***; Duration: *** Right Sidelying: ***; Duration: *** Left Sidelying: ***; Duration: *** Deep Head Hang: ***; Duration: *** Other: ***   MOTION SENSITIVITY:  Motion Sensitivity Quotient Intensity: 0 = none, 1 = Lightheaded, 2 = Mild, 3 = Moderate, 4 = Severe, 5 = Vomiting  Intensity  1. Sitting to supine   2. Supine to L side   3. Supine to R side   4. Supine to sitting   5. L Hallpike-Dix   6. Up from L    7. R Hallpike-Dix   8. Up from R    9. Sitting, head tipped to L knee   10. Head up from L knee   11. Sitting, head tipped to R knee   12. Head up from R knee   13. Sitting head turns x5  14.Sitting head nods x5   15. In stance, 180 turn to L    16. In stance, 180 turn to R     OTHOSTATICS: {Exam; orthostatics:31331}  FUNCTIONAL GAIT:  {Functional tests:24029}   VESTIBULAR TREATMENT:                                                                                                   DATE: ***  Canalith Repositioning:  {Canalith Repositioning:25283} Gaze Adaptation:  {gaze adaptation:25286} Habituation:  {habituation:25288} Other: ***  PATIENT EDUCATION: Education details: *** Person educated: {Person educated:25204} Education method: {Education Method:25205} Education comprehension: {Education Comprehension:25206}  HOME EXERCISE PROGRAM:   GOALS: Goals reviewed with patient? {yes/no:20286}  SHORT TERM GOALS: Target date: {follow up:25551}  Patient to be independent with initial HEP. Baseline: HEP initiated Goal status: {GOALSTATUS:25110}    LONG TERM GOALS: Target date: {follow up:25551}  Patient to be independent with advanced HEP. Baseline: Not yet initiated  Goal status: {GOALSTATUS:25110}  Patient to report 0/10 dizziness with standing vertical and horizontal VOR for 30 seconds. Baseline: Unable Goal status: {GOALSTATUS:25110}  Patient will report 0/10 dizziness with bed mobility.  Baseline: Symptomatic  Goal status: {GOALSTATUS:25110}  Patient to demonstrate *** sway with M-CTSIB condition with eyes closed/foam surface in order to improve safety in environments with uneven surfaces and dim lighting. Baseline: *** Goal status: {GOALSTATUS:25110}  Patient to score at least 20/24 on DGI in order to decrease risk of falls. Baseline: *** Goal status: {GOALSTATUS:25110}  Patient will ambulate over outdoor surfaces with LRAD while performing head turns to scan environment with good stability in order to indicate safe community mobility. Baseline: Unable Goal status: {GOALSTATUS:25110}  Patient to score at least *** on FOTO in order to indicate improved functional outcomes.  Baseline: *** Goal status: {GOALSTATUS:25110}    ASSESSMENT:  CLINICAL IMPRESSION:   Patient is a 77 y/o F  presenting to [facility] with c/o dizziness for the past ***.   Denies head trauma, infection/illness, vision changes/double vision, hearing loss, tinnitus, otalgia, photo/phonophobia.  Oculomotor exam revealed ***.  Positional testing was ***.   Patient was educated on gentle *** HEP and reported understanding. Would benefit from skilled PT services ***x/week for *** weeks to address aforementioned impairments in order to optimize level of function.    OBJECTIVE IMPAIRMENTS: {opptimpairments:25111}.   ACTIVITY LIMITATIONS: {activitylimitations:27494}  PARTICIPATION LIMITATIONS: {participationrestrictions:25113}  PERSONAL FACTORS: {Personal factors:25162} are also affecting patient's functional outcome.   REHAB POTENTIAL: {rehabpotential:25112}  CLINICAL DECISION MAKING: {clinical decision making:25114}  EVALUATION COMPLEXITY: {Evaluation complexity:25115}   PLAN:  PT FREQUENCY: {rehab frequency:25116}  PT DURATION: {rehab duration:25117}  PLANNED INTERVENTIONS: {rehab planned interventions:25118::"Therapeutic exercises","Therapeutic activity","Neuromuscular re-education","Balance training","Gait training","Patient/Family education","Self Care","Joint mobilization"}  PLAN FOR NEXT SESSION: Manuela Neptune, PT 04/03/2022, 9:03 AM

## 2022-04-06 ENCOUNTER — Encounter: Payer: Self-pay | Admitting: Physical Therapy

## 2022-04-06 ENCOUNTER — Ambulatory Visit: Payer: Medicare Other | Attending: Family Medicine | Admitting: Physical Therapy

## 2022-04-06 ENCOUNTER — Other Ambulatory Visit: Payer: Self-pay

## 2022-04-06 DIAGNOSIS — H8110 Benign paroxysmal vertigo, unspecified ear: Secondary | ICD-10-CM | POA: Diagnosis not present

## 2022-04-06 DIAGNOSIS — R42 Dizziness and giddiness: Secondary | ICD-10-CM | POA: Insufficient documentation

## 2022-04-08 ENCOUNTER — Telehealth: Payer: Self-pay | Admitting: Family Medicine

## 2022-04-08 DIAGNOSIS — Z1231 Encounter for screening mammogram for malignant neoplasm of breast: Secondary | ICD-10-CM | POA: Diagnosis not present

## 2022-04-08 LAB — HM MAMMOGRAPHY

## 2022-04-08 NOTE — Telephone Encounter (Signed)
Contacted Barbara Washington to schedule their annual wellness visit. Appointment made for 04/23/22.  Barbara Washington AWV direct phone # [phone]   Spoke to patient to r/s her 04/14/22 due to schedule change    r/s to 3/28  patient of appt date/time change  also changed to phone appt

## 2022-04-09 ENCOUNTER — Ambulatory Visit
Admission: RE | Admit: 2022-04-09 | Discharge: 2022-04-09 | Disposition: A | Discharge status: Home / Self Care | Payer: Medicare Other | Source: Ambulatory Visit | Attending: Family Medicine | Admitting: Family Medicine

## 2022-04-09 DIAGNOSIS — M8589 Other specified disorders of bone density and structure, multiple sites: Secondary | ICD-10-CM | POA: Diagnosis not present

## 2022-04-09 DIAGNOSIS — Z780 Asymptomatic menopausal state: Secondary | ICD-10-CM

## 2022-04-10 ENCOUNTER — Encounter: Payer: Self-pay | Admitting: Family Medicine

## 2022-04-15 NOTE — Therapy (Signed)
OUTPATIENT PHYSICAL THERAPY VESTIBULAR TREATMENT     Patient Name: Barbara Washington MRN: [id number] DOB:07/03/1945, 77 y.o., female Today's Date: 04/15/2022  END OF SESSION:    Past Medical History:  Diagnosis Date   Cataract    DCIS (ductal carcinoma in situ) of breast    Diverticular disease    Hyperlipidemia    Hypertension    Mitral valve prolapse    Past Surgical History:  Procedure Laterality Date   ABDOMINAL HYSTERECTOMY  2008   ABDOMINAL HYST - [place]   [place]     [place]   CATARACT EXTRACTION, BILATERAL     EYE SURGERY     TOTAL ABDOMINAL HYSTERECTOMY W/ BILATERAL SALPINGOOPHORECTOMY     Patient Active Problem List   Diagnosis Date Noted   Routine general medical examination at a health care facility 04/01/2022   Benign paroxysmal positional vertigo 04/01/2022   Arthralgia of both hands 02/11/2021   Hypertension, essential, benign 07/28/2019   Hyperlipidemia, unspecified 07/28/2019   Prediabetes 07/28/2019   Atherosclerosis of aorta ([place]) 07/28/2019    PCP: Martinique, Betty G, MD  REFERRING PROVIDER: Martinique, Betty G, MD   REFERRING DIAG: H81.10 (ICD-10-CM) - Benign paroxysmal positional vertigo, unspecified laterality  THERAPY DIAG:  No diagnosis found.  ONSET DATE: February 25, 2022  Rationale for Evaluation and Treatment: Rehabilitation  SUBJECTIVE:   SUBJECTIVE STATEMENT: Patient reports dizziness since January 31st. Had this problem 8 years ago as well. Has been getting better since its first onset in January. Went to urgent care and was given meds. Episodes last "a short time" and worse with bending, quick turns, rolling in bed, and getting in/out of bed. Reports that she had COVID in December and had more trouble hearing in L ear while sick. Denies head trauma, vision changes/double vision, hearing loss, tinnitus, balance changes.   Pt accompanied by: interpreter: Gigi  PERTINENT HISTORY: HTN,prediabetes, HLD, breast CA s/p  surgery   PAIN:  Are you having pain? No  PRECAUTIONS: None  WEIGHT BEARING RESTRICTIONS: No  FALLS: Has patient fallen in last 6 months? No  LIVING ENVIRONMENT: Lives with: lives with their spouse Lives in: House/apartment Stairs:  no steps to enter; 11 steps to 2nd floor with 1 handrail Has following equipment at home: Shower bench and Grab bars  PLOF: Independent  PATIENT GOALS: improve dizziness  OBJECTIVE:     TODAY'S TREATMENT: 04/16/22 Activity Comments                      HOME EXERCISE PROGRAM Last updated: 04/06/22 Access Code: [email or web address] URL: [email or web address] Date: 04/15/2022 Prepared by: [facility]  Exercises - Brandt-Daroff Vestibular Exercise  - 1 x daily - 5 x weekly - 2 sets - 3-5 reps - Seated Nose to Left Knee Vestibular Habituation  - 1 x daily - 5 x weekly - 2 sets - 3-5 reps - 10 sec hold   Below measures were taken at time of initial evaluation unless otherwise specified:   DIAGNOSTIC FINDINGS: none recent  COGNITION: Overall cognitive status: Within functional limits for tasks assessed   SENSATION: WFL; reports N/T in hands and arms at night  POSTURE:  No Significant postural limitations  GAIT: Gait pattern: L toe out, slight B hip drop Assistive device utilized: None Level of assistance: Complete Independence   PATIENT SURVEYS:  FOTO N/A  VESTIBULAR ASSESSMENT:  GENERAL OBSERVATION: pt does not wear glasses/contacts   OCULOMOTOR EXAM:  Ocular Alignment: normal  Ocular ROM: No Limitations  Spontaneous Nystagmus: absent  Gaze-Induced Nystagmus: absent  Smooth Pursuits: intact and 1-2 saccades in vertical and horizontal directions  Saccades:  slight undershooting to R    VESTIBULAR - OCULAR REFLEX:   Slow VOR: Comment: intact horizontal and vertical; no dizziness  VOR Cancellation: Normal  Head-Impulse Test: HIT Right: positive HIT Left: negative C/o  dizziness to L      POSITIONAL TESTING:  Right Roll Test: negative Left Roll Test: negative Left Sidelying: negative; mild dizziness upon sitting up Right Sidelying: c/o mild dizziness and L sided head pressure upon laying down; slightly more intense dizziness upon sitting up; possible very small amplitude R upbeating torsional nystagmus lasting ~20 sec Right Dix-Hallpike: delayed and slow R upbeating torsional nystagmus without fatigue; pt reports no dizziness  Left Dix-Hallpike: a few beats L upbeating nystagmus; pt reports no dizziness  *significant saccadic intrusions present throughout testing   VESTIBULAR TREATMENT:                                                                                                   DATE: 04/06/22  Habituation: Sitting anterior habituation 1x each- c/o mild dizziness to R   PATIENT EDUCATION: Education details: prognosis, POC, HEP, exam findings  Person educated: Patient Education method: Explanation, Demonstration, Tactile cues, Verbal cues, and Handouts Education comprehension: verbalized understanding and returned demonstration  HOME EXERCISE PROGRAM:   GOALS: Goals reviewed with patient? Yes  SHORT TERM GOALS: Target date: 04/20/22  Patient to be independent with initial HEP. Baseline: HEP initiated Goal status: IN PROGRESS    LONG TERM GOALS: Target date: 05/04/2022  Patient to be independent with advanced HEP. Baseline: Not yet initiated  Goal status: IN PROGRESS  Patient will report 0/10 dizziness with bed mobility.  Baseline: Symptomatic  Goal status: IN PROGRESS  Patient to demonstrate mild-moderate sway with M-CTSIB condition with eyes closed/foam surface in order to improve safety in environments with uneven surfaces and dim lighting. Baseline: NT Goal status: IN PROGRESS  Patient to score at least 20/24 on DGI in order to decrease risk of falls. Baseline: NT Goal status: IN PROGRESS    ASSESSMENT:  CLINICAL  IMPRESSION:   Patient is a 77 y/o F presenting to OPPT with c/o dizziness for the past 1.5 months.Reports brief episodes with bending, quick turns, rolling in bed, and getting in/out of bed. Recalls having COVID in December 2023 when she also reports having some L sided hearing loss however notes this has returned to baseline. Denies head trauma, vision changes/double vision, hearing loss, tinnitus, balance changes. Patient today presenting with gait deviations, slight undershooting to R with saccades, 1-2 saccades with smooth pursuit, positive R HIT but c/o dizziness with L HIT. Oculomotor exam revealed possible R BPPV and motion sensitivity to R>L side. Patient was educated on gentle habituation HEP and reported understanding. Would benefit from skilled PT services 1-2x/week for 4 weeks to address aforementioned impairments in order to optimize level of function.    OBJECTIVE IMPAIRMENTS: Abnormal gait, decreased activity tolerance, and dizziness.   ACTIVITY LIMITATIONS:  bending, sleeping, bed mobility, dressing, and reach over head  PARTICIPATION LIMITATIONS: meal prep, cleaning, laundry, community activity, and occupation  PERSONAL FACTORS: Age, Past/current experiences, Time since onset of injury/illness/exacerbation, and 3+ comorbidities: HTN,prediabetes, HLD, breast CA s/p surgery   are also affecting patient's functional outcome.   REHAB POTENTIAL: Good  CLINICAL DECISION MAKING: Evolving/moderate complexity  EVALUATION COMPLEXITY: Moderate   PLAN:  PT FREQUENCY: 1-2x/week  PT DURATION: 4 weeks  PLANNED INTERVENTIONS: Therapeutic exercises, Therapeutic activity, Neuromuscular re-education, Balance training, Gait training, Patient/Family education, Self Care, Joint mobilization, Stair training, Vestibular training, Canalith repositioning, Aquatic Therapy, Dry Needling, Electrical stimulation, Cryotherapy, Moist heat, Taping, Manual therapy, and Re-evaluation  PLAN FOR NEXT SESSION:  review HEP; retest positional testing if dizziness is not resolved   Janene Harvey, PT, DPT 04/15/22 9:19 AM   Outpatient Rehab at [facility] [address], [place], [place] [address] Phone # [phone] Fax # [phone]

## 2022-04-16 ENCOUNTER — Ambulatory Visit: Payer: Medicare Other | Admitting: Physical Therapy

## 2022-04-16 ENCOUNTER — Encounter: Payer: Self-pay | Admitting: Physical Therapy

## 2022-04-16 DIAGNOSIS — R42 Dizziness and giddiness: Secondary | ICD-10-CM | POA: Diagnosis not present

## 2022-04-16 DIAGNOSIS — H8110 Benign paroxysmal vertigo, unspecified ear: Secondary | ICD-10-CM | POA: Diagnosis not present

## 2022-04-23 ENCOUNTER — Encounter: Payer: Self-pay | Admitting: Family Medicine

## 2022-04-23 ENCOUNTER — Telehealth (INDEPENDENT_AMBULATORY_CARE_PROVIDER_SITE_OTHER): Payer: Medicare Other | Admitting: Family Medicine

## 2022-04-23 DIAGNOSIS — Z0000 Encounter for general adult medical examination without abnormal findings: Secondary | ICD-10-CM | POA: Diagnosis not present

## 2022-04-23 NOTE — Progress Notes (Signed)
MEDICARE WELLNESS VISIT PATIENT CHECK-IN and HEALTH RISK ASSESSMENT QUESTIONNAIRE:  -completed by phone/video for upcoming Medicare Preventive Visit  Pre-Visit Check-in: 1)Vitals (height, wt, BP, etc) - record in vitals section for visit on day of visit 2)Review and Update Medications, Allergies PMH, Surgeries, Social history in Epic 3)Hospitalizations in the last year with date/reason? no  4)Review and Update Care Team (patient's specialists) in Epic 5) Complete PHQ9 in Epic  6) Complete Fall Screening in Epic 7)Review all Health Maintenance Due and order under PCP if not done.  8)Medicare Wellness Questionnaire: Answer theses question about your habits: Do you drink alcohol? Occasional glass of wine with meals How many drinks do you have a day? occasionally Have you ever smoked?no Have you stopped smoking and date if applicable? N/A  How many packs a day do you smoke? N/A Do you use smokeless tobacco?no Do you use illicit drugs?no Do you exercises? Yes, 3-4 times per week, 1 hour Are you sexually active? No Number of partners? What did you eat for breakfast today (or yesterday)?toast with almond jam, eggs and tomatoes with coffee- Typical breakfast-often has a late lunch What did you eat for lunch today (or yesterday)? Fish and veggies Typical lunch: Soup What did you eat for diner today (or yesterday)? Vegetable soup Typical dinner Typical snacks:half of muffin or cookies What beverages do you drink besides water:coffee  Answer theses question about you: Can you perform most household chores?yes Do you find it hard to follow a conversation in a noisy room?no Do you find it hard to understand a speaker at church or in a meeting?no Do you often ask people to speak up or repeat themselves?no Do you experience ringing in your ears?no Do you have difficulty understanding a soft or whispered voice?no Do you feel that you have a problem with memory?yes Do you often misplace  items?yes Do you balance your checkbook and or bank acounts?yes Do you feel safe at home?yes Last dentist visit? 04/22/2022 Do you need assistance with any of the following:  Driving?no  Feeding yourself?no  Getting from bed to chair?no  Getting to the toilet?no  Bathing or showering?no  Dressing yourself?no  Managing money?no  Climbing a flight of stairs?no  Preparing meals?no  Do you have Advanced Directives in place (Living Will, Healthcare Power or Attorney)? no   Last eye Exam and location?September 2023-Dr Parker   Do you currently use prescribed or non-prescribed narcotic or opioid pain medications?no  Do you have a history or close family history of breast, ovarian, tubal or peritoneal cancer or a family member with BRCA 1/2 (breast cancer susceptibility 1 and 2) gene mutations? 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  Nurse/Assistant Credentials/time stamp:J Callen Vancuren,CMA   ----------------------------------------------------------------------------------------------------------------------------------------------------------------------------------------------------------------------   MEDICARE ANNUAL PREVENTIVE VISIT WITH PROVIDER: (Welcome to [employer], initial annual wellness or annual wellness exam)  Virtual Visit via Phone Note  I connected with Barbara Washington on 04/23/22 by phone a video enabled telemedicine application and verified that I am speaking with the correct person using two identifiers. Utilized interpreter services via [employer] [phone] advised per PCP office.   Location patient: home Location provider:work or home office Persons participating in the virtual visit: patient, provider, interpreter  Concerns and/or follow up today: stable since recent visit with her doctor.    See HM section in Epic for other details of completed HM.    ROS: negative for report of fevers, unintentional weight loss, vision  changes, vision loss, hearing loss or change, chest  pain, sob, hemoptysis, melena, hematochezia, hematuria, , falls, bleeding or bruising, loc,  memory loss  Patient-completed extensive health risk assessment - reviewed and discussed with the patient: See Health Risk Assessment completed with patient prior to the visit either above or in recent phone note. This was reviewed in detailed with the patient today and appropriate recommendations, orders and referrals were placed as needed per Summary below and patient instructions.   Review of Medical History: -PMH, PSH, Family History and current specialty and care providers reviewed and updated and listed below   Patient Care Team: [place], Betty G, MD as PCP - General (Family Medicine) Terrance Mass, MD (Inactive) as Consulting Physician (Gynecology)   Past Medical History:  Diagnosis Date   Cataract    DCIS (ductal carcinoma in situ) of breast    Diverticular disease    Hyperlipidemia    Hypertension    Mitral valve prolapse     Past Surgical History:  Procedure Laterality Date   ABDOMINAL HYSTERECTOMY  2008   ABDOMINAL HYST - NEW [place]   BREAST SURGERY     WISCONSIN   CATARACT EXTRACTION, BILATERAL     EYE SURGERY     TOTAL ABDOMINAL HYSTERECTOMY W/ BILATERAL SALPINGOOPHORECTOMY      Social History   Socioeconomic History   Marital status: Married    Spouse name: Not on file   Number of children: Not on file   Years of education: Not on file   Highest education level: Not on file  Occupational History   Not on file  Tobacco Use   Smoking status: Never   Smokeless tobacco: Never  Substance and Sexual Activity   Alcohol use: Yes    Alcohol/week: 0.0 - 1.0 standard drinks of alcohol   Drug use: No   Sexual activity: Yes    Birth control/protection: Surgical  Other Topics Concern   Not on file  Social History Narrative   Not on file   Social Determinants of Health   Financial Resource Strain: Low Risk   (04/11/2021)   Overall Financial Resource Strain (CARDIA)    Difficulty of Paying Living Expenses: Not hard at all  Food Insecurity: No Food Insecurity (04/11/2021)   Hunger Vital Sign    Worried About Running Out of Food in the Last Year: Never true    Ran Out of Food in the Last Year: Never true  Transportation Needs: No Transportation Needs (04/11/2021)   PRAPARE - Hydrologist (Medical): No    Lack of Transportation (Non-Medical): No  Physical Activity: Sufficiently Active (04/11/2021)   Exercise Vital Sign    Days of Exercise per Week: 3 days    Minutes of Exercise per Session: 60 min  Stress: No Stress Concern Present (04/11/2021)   [place]    Feeling of Stress : Not at all  Social Connections: [place] (04/11/2021)   Social Connection and Isolation Panel [NHANES]    Frequency of Communication with Friends and Family: More than three times a week    Frequency of Social Gatherings with Friends and Family: More than three times a week    Attends Religious Services: More than 4 times per year    Active Member of [employer] or Organizations: Yes    Attends Archivist Meetings: More than 4 times per year    Marital Status: Married  Human resources officer Violence: Not At Risk (04/11/2021)   Humiliation, Afraid, Rape, and  Kick questionnaire    Fear of Current or Ex-Partner: No    Emotionally Abused: No    Physically Abused: No    Sexually Abused: No    Family History  Problem Relation Age of Onset   Hypertension Father    Hypertension Sister    Diabetes Brother     Current Outpatient Medications on File Prior to Visit  Medication Sig Dispense Refill   metoprolol succinate (TOPROL-XL) 25 MG 24 hr tablet Take 1 tablet by mouth once daily 90 tablet 2   Multiple Vitamin (MULTIVITAMIN) capsule Take 1 capsule by mouth daily.     omeprazole (PRILOSEC) 40 MG capsule Take 40 mg by  mouth daily.     rosuvastatin (CRESTOR) 10 MG tablet Take 1 tablet by mouth once daily 90 tablet 2   triamterene-hydrochlorothiazide (MAXZIDE-25) 37.5-25 MG tablet Take 1 tablet by mouth once daily 90 tablet 2   No current facility-administered medications on file prior to visit.    Allergies  Allergen Reactions   Losartan Swelling   Aspirin Swelling   Ibuprofen Nausea And Vomiting and Other (See Comments)   Tape Rash       Physical Exam There were no vitals filed for this visit. Estimated body mass index is 33.69 kg/m as calculated from the following:   Height as of 04/01/22: 4\' 8"  (1.422 m).   Weight as of 04/01/22: 150 lb 4 oz (68.2 kg).  EKG (optional): deferred due to virtual visit  GENERAL: alert, oriented, no acute distress detected, full vision exam deferred due to pandemic and/or virtual encounter  PSYCH/NEURO: pleasant and cooperative, no obvious depression or anxiety, speech and thought processing grossly intact, Cognitive function grossly intact  Flowsheet Row Video Visit from 04/23/2022 in [facility]  PHQ-9 Total Score 0           04/23/2022    9:26 AM 04/01/2022    8:29 AM 10/01/2021    9:31 AM 06/18/2021    8:30 AM 04/11/2021    8:41 AM  Depression screen PHQ 2/9  Decreased Interest 0 0 0 0 0  Down, Depressed, Hopeless 0 0 0 0 0  PHQ - 2 Score 0 0 0 0 0  Altered sleeping 0      Tired, decreased energy 0      Change in appetite 0      Feeling bad or failure about yourself  0      Trouble concentrating 0      Moving slowly or fidgety/restless 0      Suicidal thoughts 0      PHQ-9 Score 0           02/11/2021    9:30 AM 04/11/2021    8:44 AM 06/18/2021    8:30 AM 10/01/2021    9:31 AM 04/23/2022    9:26 AM  Fall Risk  Falls in the past year? 0 0 0 0 0  Was there an injury with Fall?  0 0 0 0  Fall Risk Category Calculator  0 0 0 0  Fall Risk Category (Retired)  Low Low Low   (RETIRED) Patient Fall Risk Level Low fall  risk Low fall risk Low fall risk Low fall risk   Patient at Risk for Falls Due to  No Fall Risks No Fall Risks Other (Comment) No Fall Risks  Fall risk Follow up   Falls evaluation completed Falls evaluation completed Falls evaluation completed     SUMMARY AND PLAN:  Encounter for Medicare annual wellness exam   Discussed applicable health maintenance/preventive health measures and advised and referred or ordered per patient preferences:  Health Maintenance  Topic Date Due   Medicare Annual Wellness (AWV)  04/12/2022   INFLUENZA VACCINE  She plans to get in the fall   COVID-19 Vaccine (4 - 2023-24 season) She plans to get in the fall   Zoster Vaccines- Shingrix (1 of 2) 01/29/2023 (Originally 09/07/1995) - she reports she has done one dose and plans to do 2nd dose - she had not known there were two doses.    DEXA SCAN  Completed   Hepatitis C Screening  Completed   HPV VACCINES  Aged Out   DTaP/Tdap/Td  Discontinued   Pneumonia Vaccine 35+ Years old  Discontinued   COLONOSCOPY (Pts 45-57yrs Insurance coverage will need to be confirmed)  Discontinued    Education and counseling on the following was provided based on the above review of health and a plan/checklist for the patient, along with additional information discussed, was provided for the patient in the patient instructions :  -Advised on importance of  advanced directives - she reports she does not have any further questions as her daughter will handle setting this up for her.  -Advised and counseled on maintaining healthy weight and healthy lifestyle - including the importance of a healthy diet, regular physical activity, social connections and stress management. -Advised and counseled on a whole foods based healthy diet and regular exercise. A summary of a healthy diet was provided in the Patient Instructions.. Recommended continued regular exercise. -Advise yearly dental visits at minimum and regular eye exams -Advised and  counseled on alcohol limits for women.  Follow up: see patient instructions     Patient Instructions  I really enjoyed getting to talk with you today! I am available on Tuesdays and Thursdays for virtual visits if you have any questions or concerns, or if I can be of any further assistance.   CHECKLIST FROM ANNUAL WELLNESS VISIT:  -Follow up (please call to schedule if not scheduled after visit):   -yearly for annual wellness visit with primary care office  Here is a list of your preventive care/health maintenance measures and the plan for each if any are due:  Health Maintenance  Topic Date Due   INFLUENZA VACCINE  Please get at each fall at the office or pharmacy   COVID-19 Vaccine (4 - 2023-24 season) Can get at the pharmacy   Zoster Vaccines- Shingrix (1 of 2) Please get your 2nd dose and provide [place] records so that we can update in your chart.    Medicare Annual Wellness (AWV)  04/23/2023   DEXA SCAN  Completed   Hepatitis C Screening  Completed   HPV VACCINES  Aged Out   DTaP/Tdap/Td  Discontinued   Pneumonia Vaccine 44+ Years old  Discontinued   COLONOSCOPY (Pts 45-46yrs Insurance coverage will need to be confirmed)  Discontinued    -See a dentist at least yearly  -Get your eyes checked and then per your eye specialist's recommendations  -Other issues addressed today:  -I have included below further information regarding a healthy whole foods based diet, physical activity guidelines for adults, stress management and opportunities for social connections. I hope you find this information useful.   -----------------------------------------------------------------------------------------------------------------------------------------------------------------------------------------------------------------------------------------------------------  NUTRITION: -eat real food: lots of colorful vegetables (half the plate) and fruits -5-7 servings of vegetables and fruits per  day (fresh or steamed is best), exp. 2 servings of vegetables  with lunch and dinner and 2 servings of fruit per day. Berries and greens such as kale and collards are great choices.  -consume on a regular basis: whole grains (make sure first ingredient on label contains the word "whole"), fresh fruits, fish, nuts, seeds, healthy oils (such as olive oil, avocado oil, grape seed oil) -may eat small amounts of dairy and lean meat on occasion, but avoid processed meats such as ham, bacon, lunch meat, etc. -drink water -try to avoid fast food and pre-packaged foods, processed meat -most experts advise limiting sodium to < 2300mg  per day, should limit further is any chronic conditions such as high blood pressure, heart disease, diabetes, etc. The American Heart Association advised that < 1500mg  is is ideal -try to avoid foods that contain any ingredients with names you do not recognize  -try to avoid sugar/sweets (except for the natural sugar that occurs in fresh fruit) -try to avoid sweet drinks -try to avoid white rice, white bread, pasta (unless whole grain), white or yellow potatoes  EXERCISE GUIDELINES FOR ADULTS: -if you wish to increase your physical activity, do so gradually and with the approval of your doctor -STOP and seek medical care immediately if you have any chest pain, chest discomfort or trouble breathing when starting or increasing exercise  -move and stretch your body, legs, feet and arms when sitting for long periods -Physical activity guidelines for optimal health in adults: -least 150 minutes per week of aerobic exercise (can talk, but not sing) once approved by your doctor, 20-30 minutes of sustained activity or two 10 minute episodes of sustained activity every day.  -resistance training at least 2 days per week if approved by your doctor -balance exercises 3+ days per week:   Stand somewhere where you have something sturdy to hold onto if you lose balance.    1) lift up on  toes, start with 5x per day and work up to 20x   2) stand and lift on leg straight out to the side so that foot is a few inches of the floor, start with 5x each side and work up to 20x each side   3) stand on one foot, start with 5 seconds each side and work up to 20 seconds on each side  If you need ideas or help with getting more active:  -Silver sneakers https://tools.silversneakers.com  -Walk with a Doc: http://stephens-thompson.biz/  -try to include resistance (weight lifting/strength building) and balance exercises twice per week: or the following link for ideas: ChessContest.fr  UpdateClothing.com.cy  STRESS MANAGEMENT: -can try meditating, or just sitting quietly with deep breathing while intentionally relaxing all parts of your body for 5 minutes daily -if you need further help with stress, anxiety or depression please follow up with your primary doctor or contact the wonderful folks at [place]: [place]: -options in [place] if you wish to engage in more social and exercise related activities:  -Silver sneakers https://tools.silversneakers.com  -Walk with a Doc: http://stephens-thompson.biz/  -Check out the [place] 50+ section on the [place] of [employer] (hiking clubs, book clubs, cards and games, chess, exercise classes, aquatic classes and much more) - see the website for details: https://www.[place].gov/departments/parks-recreation/active-adults50  -YouTube has lots of exercise videos for different ages and abilities as well  -[place] (a variety of indoor and outdoor inperson activities for adults). [phone]. [address].  -Virtual Online Classes (a variety of topics): see seniorplanet.org or call [phone]  -consider volunteering at  a school, hospice center, church, senior  center or elsewhere         [place]:  Everyone should have advanced health care directives in place. This is so that you get the care you want, should you ever be in a situation where you are unable to make your own medical decisions.   From the [place] Advanced Directive Website: "[place] are legal documents in which you give written instructions about your health care if, in the future, you cannot speak for yourself.   A health care power of attorney allows you to name a person you trust to make your health care decisions if you cannot make them yourself. A declaration of a desire for a natural death (or living will) is document, which states that you desire not to have your life prolonged by extraordinary measures if you have a terminal or incurable illness or if you are in a vegetative state. An advance instruction for mental health treatment makes a declaration of instructions, information and preferences regarding your mental health treatment. It also states that you are aware that the advance instruction authorizes a mental health treatment provider to act according to your wishes. It may also outline your consent or refusal of mental health treatment. A declaration of an anatomical gift allows anyone over the age of 62 to make a gift by will, organ donor card or other document."   Please see the following website or an elder law attorney for forms, FAQs and for completion of advanced directives: [place] Secretary of [place] ([email or web address].no)  Or copy and paste the following to your web browser: PokerReunion.com.cy    Lucretia Kern, DO

## 2022-04-23 NOTE — Patient Instructions (Addendum)
I really enjoyed getting to talk with you today! I am available on Tuesdays and Thursdays for virtual visits if you have any questions or concerns, or if I can be of any further assistance.   CHECKLIST FROM ANNUAL WELLNESS VISIT:  -Follow up (please call to schedule if not scheduled after visit):   -yearly for annual wellness visit with primary care office  Here is a list of your preventive care/health maintenance measures and the plan for each if any are due:  Health Maintenance  Topic Date Due   INFLUENZA VACCINE  Please get at each fall at the office or pharmacy   COVID-19 Vaccine (4 - 2023-24 season) Can get at the pharmacy   Zoster Vaccines- Shingrix (1 of 2) Please get your 2nd dose and provide [place] records so that we can update in your chart.    Medicare Annual Wellness (AWV)  04/23/2023   DEXA SCAN  Completed   Hepatitis C Screening  Completed   HPV VACCINES  Aged Out   DTaP/Tdap/Td  Discontinued   Pneumonia Vaccine 35+ Years old  Discontinued   COLONOSCOPY (Pts 45-30yrs Insurance coverage will need to be confirmed)  Discontinued    -See a dentist at least yearly  -Get your eyes checked and then per your eye specialist's recommendations  -Other issues addressed today:  -I have included below further information regarding a healthy whole foods based diet, physical activity guidelines for adults, stress management and opportunities for social connections. I hope you find this information useful.   -----------------------------------------------------------------------------------------------------------------------------------------------------------------------------------------------------------------------------------------------------------  NUTRITION: -eat real food: lots of colorful vegetables (half the plate) and fruits -5-7 servings of vegetables and fruits per day (fresh or steamed is best), exp. 2 servings of vegetables with lunch and dinner and 2 servings of fruit  per day. Berries and greens such as kale and collards are great choices.  -consume on a regular basis: whole grains (make sure first ingredient on label contains the word "whole"), fresh fruits, fish, nuts, seeds, healthy oils (such as olive oil, avocado oil, grape seed oil) -may eat small amounts of dairy and lean meat on occasion, but avoid processed meats such as ham, bacon, lunch meat, etc. -drink water -try to avoid fast food and pre-packaged foods, processed meat -most experts advise limiting sodium to < 2300mg  per day, should limit further is any chronic conditions such as high blood pressure, heart disease, diabetes, etc. The American Heart Association advised that < 1500mg  is is ideal -try to avoid foods that contain any ingredients with names you do not recognize  -try to avoid sugar/sweets (except for the natural sugar that occurs in fresh fruit) -try to avoid sweet drinks -try to avoid white rice, white bread, pasta (unless whole grain), white or yellow potatoes  EXERCISE GUIDELINES FOR ADULTS: -if you wish to increase your physical activity, do so gradually and with the approval of your doctor -STOP and seek medical care immediately if you have any chest pain, chest discomfort or trouble breathing when starting or increasing exercise  -move and stretch your body, legs, feet and arms when sitting for long periods -Physical activity guidelines for optimal health in adults: -least 150 minutes per week of aerobic exercise (can talk, but not sing) once approved by your doctor, 20-30 minutes of sustained activity or two 10 minute episodes of sustained activity every day.  -resistance training at least 2 days per week if approved by your doctor -balance exercises 3+ days per week:   Stand somewhere where you  have something sturdy to hold onto if you lose balance.    1) lift up on toes, start with 5x per day and work up to 20x   2) stand and lift on leg straight out to the side so that foot  is a few inches of the floor, start with 5x each side and work up to 20x each side   3) stand on one foot, start with 5 seconds each side and work up to 20 seconds on each side  If you need ideas or help with getting more active:  -Silver sneakers https://tools.silversneakers.com  -Walk with a Doc: http://stephens-thompson.biz/  -try to include resistance (weight lifting/strength building) and balance exercises twice per week: or the following link for ideas: ChessContest.fr  UpdateClothing.com.cy  STRESS MANAGEMENT: -can try meditating, or just sitting quietly with deep breathing while intentionally relaxing all parts of your body for 5 minutes daily -if you need further help with stress, anxiety or depression please follow up with your primary doctor or contact the wonderful folks at [facility]: [place]: -options in [place] if you wish to engage in more social and exercise related activities:  -Silver sneakers https://tools.silversneakers.com  -Walk with a Doc: http://stephens-thompson.biz/  -Check out the [place] 50+ section on the [place] of [employer] (hiking clubs, book clubs, cards and games, chess, exercise classes, aquatic classes and much more) - see the website for details: https://www.[place].gov/departments/parks-recreation/active-adults50  -YouTube has lots of exercise videos for different ages and abilities as well  -[facility] (a variety of indoor and outdoor inperson activities for adults). 779-094-4738. [address]  -Virtual Online Classes (a variety of topics): see seniorplanet.org or call [phone]  -consider volunteering at a school, hospice center, church, senior center or elsewhere         ADVANCED HEALTHCARE DIRECTIVES:  Everyone should have advanced health care  directives in place. This is so that you get the care you want, should you ever be in a situation where you are unable to make your own medical decisions.   From the [place] Advanced Directive Website: "[place] are legal documents in which you give written instructions about your health care if, in the future, you cannot speak for yourself.   A health care power of attorney allows you to name a person you trust to make your health care decisions if you cannot make them yourself. A declaration of a desire for a natural death (or living will) is document, which states that you desire not to have your life prolonged by extraordinary measures if you have a terminal or incurable illness or if you are in a vegetative state. An advance instruction for mental health treatment makes a declaration of instructions, information and preferences regarding your mental health treatment. It also states that you are aware that the advance instruction authorizes a mental health treatment provider to act according to your wishes. It may also outline your consent or refusal of mental health treatment. A declaration of an anatomical gift allows anyone over the age of 73 to make a gift by will, organ donor card or other document."   Please see the following website or an elder law attorney for forms, FAQs and for completion of advanced directives: [place] Secretary of [place] ([email or web address].no)  Or copy and paste the following to your web browser: PokerReunion.com.cy

## 2022-08-10 ENCOUNTER — Telehealth: Payer: Self-pay | Admitting: Family Medicine

## 2022-08-10 MED ORDER — ROSUVASTATIN CALCIUM 10 MG PO TABS: 10.0000 mg | ORAL_TABLET | Freq: Every day | ORAL | 0 refills | Status: DC

## 2022-08-10 MED ORDER — METOPROLOL SUCCINATE ER 25 MG PO TB24: 25.0000 mg | ORAL_TABLET | Freq: Every day | ORAL | 0 refills | Status: DC

## 2022-08-10 MED ORDER — TRIAMTERENE-HCTZ 37.5-25 MG PO TABS: 1.0000 | ORAL_TABLET | Freq: Every day | ORAL | 0 refills | Status: DC

## 2022-08-10 MED ORDER — OMEPRAZOLE 40 MG PO CPDR: 40.0000 mg | DELAYED_RELEASE_CAPSULE | Freq: Every day | ORAL | 0 refills | Status: DC

## 2022-08-10 NOTE — Telephone Encounter (Signed)
Pt will be moving to [facility] on 08-27-2022 and will see new md on 09-17-2022 and would like a refill on these medications metoprolol succinate (TOPROL-XL) 25 MG 24 hr tablet , rosuvastatin (CRESTOR) 10 MG tablet , triamterene-hydrochlorothiazide (MAXZIDE-25) 37.5-25 MG tablet   [facility] [address], [place] - [address] Phone: [phone]  Fax: [phone]    Also daughter would like a callback once rx's has been sent

## 2022-08-10 NOTE — Telephone Encounter (Signed)
Rx's sent in, patient's daughter is aware.

## 2022-08-13 ENCOUNTER — Telehealth: Payer: Self-pay | Admitting: Family Medicine

## 2022-08-13 NOTE — Telephone Encounter (Signed)
Pt found out her insurance doesn't cover [employer].  Prescriptions were transferred last week, however they need all of their prescriptions transferred again to [facility].  Please call pt's daughter once this has been done to let her know.   Pharmacy- [facility]- [address] [place], [place], [place] [address]         [phone]

## 2022-08-14 MED ORDER — METOPROLOL SUCCINATE ER 25 MG PO TB24: 25.0000 mg | ORAL_TABLET | Freq: Every day | ORAL | 0 refills | Status: DC

## 2022-08-14 MED ORDER — ROSUVASTATIN CALCIUM 10 MG PO TABS: 10.0000 mg | ORAL_TABLET | Freq: Every day | ORAL | 0 refills | Status: DC

## 2022-08-14 MED ORDER — TRIAMTERENE-HCTZ 37.5-25 MG PO TABS: 1.0000 | ORAL_TABLET | Freq: Every day | ORAL | 0 refills | Status: DC

## 2022-08-14 MED ORDER — OMEPRAZOLE 40 MG PO CPDR: 40.0000 mg | DELAYED_RELEASE_CAPSULE | Freq: Every day | ORAL | 0 refills | Status: AC

## 2022-08-14 NOTE — Telephone Encounter (Signed)
Rx's sent, pt's daughter is aware.

## 2022-09-10 ENCOUNTER — Encounter (INDEPENDENT_AMBULATORY_CARE_PROVIDER_SITE_OTHER): Payer: Self-pay

## 2022-09-12 ENCOUNTER — Other Ambulatory Visit: Payer: Self-pay | Admitting: Family Medicine

## 2022-09-30 NOTE — Progress Notes (Deleted)
HPI: [name] is a 77 y.o. female, who is here today for chronic disease management.  Last seen on *** Hypertension:  Medications:*** BP readings at home:*** Side effects:***  Negative for unusual or severe headache, visual changes, exertional chest pain, dyspnea,  focal weakness, or edema.  Lab Results  Component Value Date   CREATININE 0.70 04/01/2022   BUN 16 04/01/2022   NA 140 04/01/2022   K 3.7 04/01/2022   CL 101 04/01/2022   CO2 30 04/01/2022   Lab Results  Component Value Date   HGBA1C 6.0 04/01/2022   *** Review of Systems See other pertinent positives and negatives in HPI.  Current Outpatient Medications on File Prior to Visit  Medication Sig Dispense Refill   metoprolol succinate (TOPROL-XL) 25 MG 24 hr tablet Take 1 tablet by mouth once daily 90 tablet 0   Multiple Vitamin (MULTIVITAMIN) capsule Take 1 capsule by mouth daily.     omeprazole (PRILOSEC) 40 MG capsule Take 1 capsule (40 mg total) by mouth daily. 90 capsule 0   rosuvastatin (CRESTOR) 10 MG tablet Take 1 tablet by mouth once daily 90 tablet 0   triamterene-hydrochlorothiazide (MAXZIDE-25) 37.5-25 MG tablet Take 1 tablet by mouth once daily 90 tablet 0   No current facility-administered medications on file prior to visit.    Past Medical History:  Diagnosis Date   Cataract    DCIS (ductal carcinoma in situ) of breast    Diverticular disease    Hyperlipidemia    Hypertension    Mitral valve prolapse    Allergies  Allergen Reactions   Losartan Swelling   Aspirin Swelling   Ibuprofen Nausea And Vomiting and Other (See Comments)   Tape Rash    Social History   Socioeconomic History   Marital status: Married    Spouse name: Not on file   Number of children: Not on file   Years of education: Not on file   Highest education level: Not on file  Occupational History   Not on file  Tobacco Use   Smoking status: Never   Smokeless tobacco: Never  Substance and  Sexual Activity   Alcohol use: Yes    Alcohol/week: 0.0 - 1.0 standard drinks of alcohol   Drug use: No   Sexual activity: Yes    Birth control/protection: Surgical  Other Topics Concern   Not on file  Social History Narrative   Not on file   Social Determinants of Health   Financial Resource Strain: Low Risk  (04/11/2021)   Overall Financial Resource Strain (CARDIA)    Difficulty of Paying Living Expenses: Not hard at all  Food Insecurity: No Food Insecurity (04/11/2021)   Hunger Vital Sign    Worried About Running Out of Food in the Last Year: Never true    Ran Out of Food in the Last Year: Never true  Transportation Needs: No Transportation Needs (04/11/2021)   PRAPARE - Administrator, Civil Service (Medical): No    Lack of Transportation (Non-Medical): No  Physical Activity: Sufficiently Active (04/11/2021)   Exercise Vital Sign    Days of Exercise per Week: 3 days    Minutes of Exercise per Session: 60 min  Stress: No Stress Concern Present (04/11/2021)   [employer] of Occupational Health - Occupational Stress Questionnaire    Feeling of Stress : Not at all  Social Connections: Socially Integrated (04/11/2021)   Social Connection and Isolation Panel [NHANES]    Frequency  of Communication with Friends and Family: More than three times a week    Frequency of Social Gatherings with Friends and Family: More than three times a week    Attends Religious Services: More than 4 times per year    Active Member of [employer] or Organizations: Yes    Attends Engineer, structural: More than 4 times per year    Marital Status: Married    There were no vitals filed for this visit. There is no height or weight on file to calculate BMI.  Physical Exam  ASSESSMENT AND PLAN:  There are no diagnoses linked to this encounter.  No orders of the defined types were placed in this encounter.   No problem-specific Assessment & Plan notes found for this  encounter.   No follow-ups on file.  Betty G. [place], MD  [facility]. [place] office.

## 2022-10-02 ENCOUNTER — Ambulatory Visit: Payer: Medicare Other | Admitting: Family Medicine

## 2022-10-02 DIAGNOSIS — R7303 Prediabetes: Secondary | ICD-10-CM

## 2022-11-16 ENCOUNTER — Other Ambulatory Visit: Payer: Self-pay | Admitting: Family Medicine

## 2022-11-27 ENCOUNTER — Other Ambulatory Visit: Payer: Self-pay | Admitting: Family Medicine

## 2024-01-18 IMAGING — DX DG CHEST 2V
2 series · 2 of 2 positions shown · non-contrast
Comparison: August 01, 2015

CLINICAL DATA: Cough and wheezing for 3 days

EXAM:
CHEST - 2 VIEW

[chest pa]
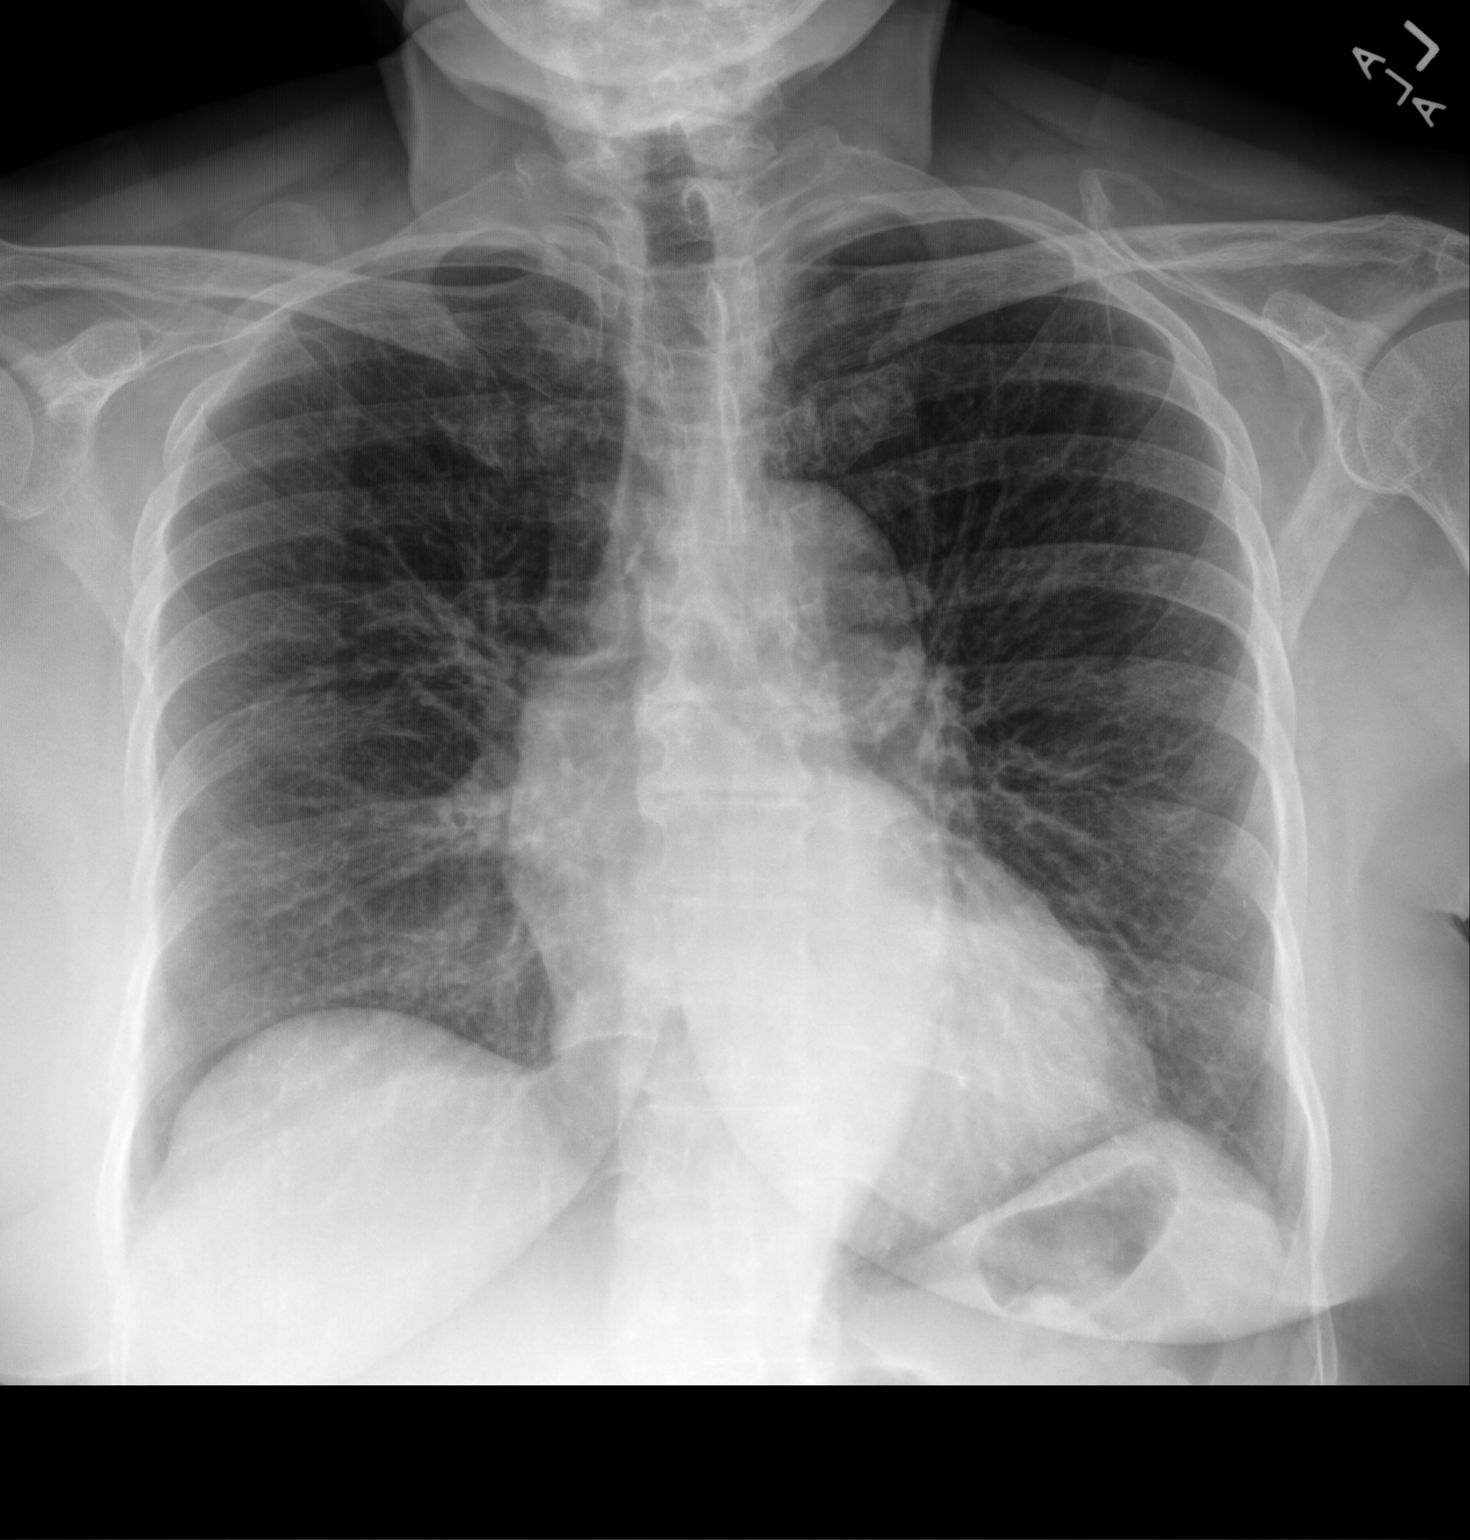

[chest lat]
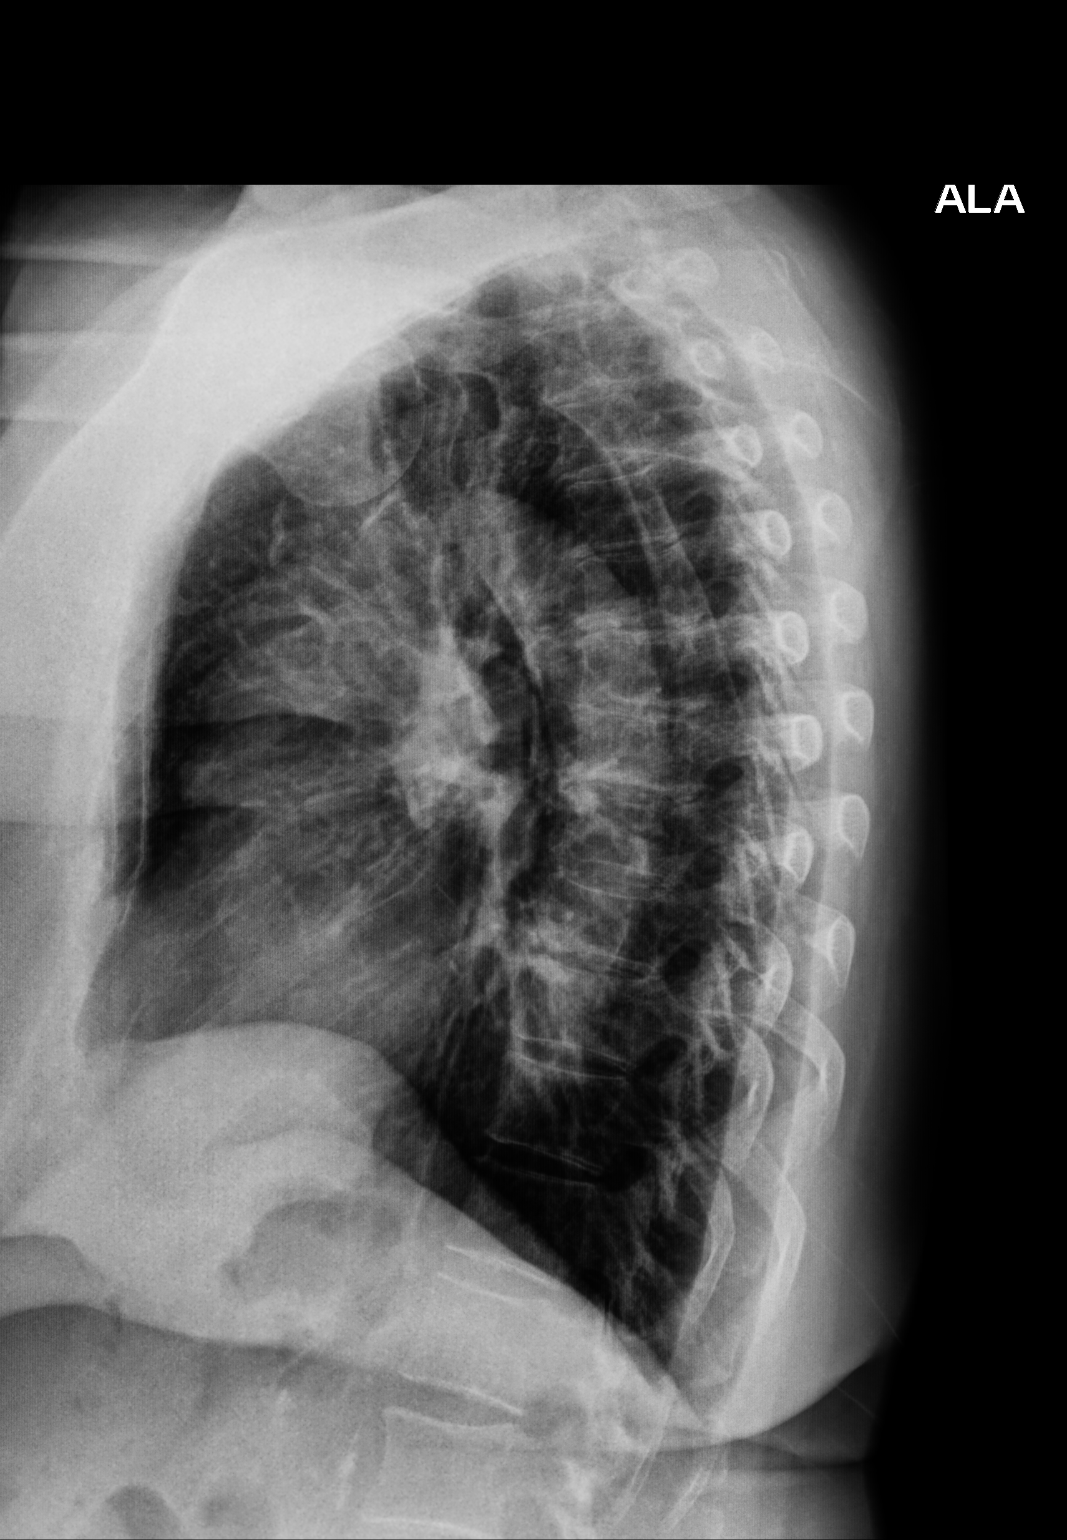

[2 of 2 positions shown; findings below may reference images not displayed]

FINDINGS: A tortuous mildly prominent thoracic aorta is unchanged since 9509.
Heart, hila, and mediastinum are stable. No pneumothorax. No nodules
or masses. No focal infiltrates.
IMPRESSION: No active cardiopulmonary disease.
